# Patient Record
Sex: Female | Born: 1995 | Race: White | Hispanic: Yes | Marital: Single | Smoking: Never smoker
Health system: Southern US, Community
[De-identification: ages and names within clinical notes are randomized; demographics above are authoritative.]

## PROBLEM LIST (undated history)

## (undated) DIAGNOSIS — D649 Anemia, unspecified: Secondary | ICD-10-CM

## (undated) DIAGNOSIS — E119 Type 2 diabetes mellitus without complications: Secondary | ICD-10-CM

## (undated) HISTORY — DX: Type 2 diabetes mellitus without complications: E11.9

## (undated) HISTORY — DX: Anemia, unspecified: D64.9

---
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
## 2013-11-18 ENCOUNTER — Emergency Department (HOSPITAL_COMMUNITY)
Admission: EM | Admit: 2013-11-18 | Discharge: 2013-11-19 | Disposition: A | Discharge status: Home / Self Care | Payer: Self-pay | Attending: Emergency Medicine | Admitting: Emergency Medicine

## 2013-11-18 ENCOUNTER — Encounter (HOSPITAL_COMMUNITY): Payer: Self-pay | Admitting: Emergency Medicine

## 2013-11-18 DIAGNOSIS — R0602 Shortness of breath: Secondary | ICD-10-CM | POA: Insufficient documentation

## 2013-11-18 DIAGNOSIS — Z79899 Other long term (current) drug therapy: Secondary | ICD-10-CM | POA: Insufficient documentation

## 2013-11-18 DIAGNOSIS — R599 Enlarged lymph nodes, unspecified: Secondary | ICD-10-CM | POA: Insufficient documentation

## 2013-11-18 DIAGNOSIS — R062 Wheezing: Secondary | ICD-10-CM | POA: Insufficient documentation

## 2013-11-18 DIAGNOSIS — R509 Fever, unspecified: Secondary | ICD-10-CM | POA: Insufficient documentation

## 2013-11-18 DIAGNOSIS — J069 Acute upper respiratory infection, unspecified: Secondary | ICD-10-CM | POA: Insufficient documentation

## 2013-11-18 MED ORDER — IPRATROPIUM BROMIDE 0.02 % IN SOLN
0.5000 mg | Freq: Once | RESPIRATORY_TRACT | Status: AC
  Administered 2013-11-19: 0.5 mg via RESPIRATORY_TRACT
  Filled 2013-11-18: qty 2.5

## 2013-11-18 MED ORDER — ALBUTEROL SULFATE (5 MG/ML) 0.5% IN NEBU
2.5000 mg | INHALATION_SOLUTION | Freq: Once | RESPIRATORY_TRACT | Status: AC
  Administered 2013-11-19: 2.5 mg via RESPIRATORY_TRACT
  Filled 2013-11-18: qty 0.5

## 2013-11-18 MED ORDER — PREDNISONE 20 MG PO TABS
60.0000 mg | ORAL_TABLET | Freq: Once | ORAL | Status: AC
  Administered 2013-11-19: 60 mg via ORAL
  Filled 2013-11-18: qty 3

## 2013-11-18 NOTE — ED Notes (Signed)
States has had a fever since the 24th, with cough and nasal congestion. Had been visiting nephews on pediatrics who had similar symptoms. No hx of asthma.

## 2013-11-18 NOTE — ED Notes (Signed)
Pt states that she was visiting her brother in the hospital and believes that she may have caught something. Pt states that she has been having chills, dry cough, achyness and sore throat since Tuesday.

## 2013-11-18 NOTE — ED Provider Notes (Signed)
CSN: [id number]     Arrival date & time 11/18/13  2106 History   First MD Initiated Contact with Patient 11/18/13 2230 This chart was scribed for non-physician practitioner Antony Madura, PA-C working with Hurman Horn, MD by Valera Castle, ED scribe. This patient was seen in room TR07C/TR07C and the patient's care was started at 11:31 PM.     Chief Complaint  Patient presents with  . URI   The history is provided by the patient. No language interpreter was used.   HPI Comments: Julie WORRALL is a 17 y.o. female who presents to the Emergency Department complaining of gradually worsening URI symptoms, including dry cough, sore throat, pain when swallowing, chills, and fever, onset 2 weeks ago. She reports today having SOB and wheezing. She reports her max temperature for her fever was 101-102F. She states she thinks she picked up something visiting her brother in the ED. She reports her brother had similar symptoms. She reports she has taken cold and cough medicine, alka-seltzer, and Tylenol, without relief. She denies any other associated symptoms. She denies h/o asthma.  PCP - No PCP Per Patient  History reviewed. No pertinent past medical history. History reviewed. No pertinent past surgical history. History reviewed. No pertinent family history. History  Substance Use Topics  . Smoking status: Never Smoker   . Smokeless tobacco: Not on file  . Alcohol Use: No   OB History   Grav Para Term Preterm Abortions TAB SAB Ect Mult Living                 Review of Systems  Constitutional: Positive for fever and chills.  HENT: Positive for sore throat (pain with swallowing).   Respiratory: Positive for cough (dry), shortness of breath and wheezing.   All other systems reviewed and are negative.   Allergies  Review of patient's allergies indicates no known allergies.  Home Medications   Current Outpatient Rx  Name  Route  Sig  Dispense  Refill  . Phenyleph-CPM-DM-Aspirin  (ALKA-SELTZER PLUS COLD & COUGH PO)   Oral   Take 1 Package by mouth 2 (two) times daily as needed (cough and cold symptoms).         . Phenylephrine-Pheniramine-DM (THERAFLU COLD & COUGH PO)   Oral   Take 1 Package by mouth 2 (two) times daily as needed (cough and cold symptoms).         . fluticasone (FLONASE) 50 MCG/ACT nasal spray   Each Nare   Place 2 sprays into both nostrils daily.   16 g   0   . HYDROcodone-homatropine (HYCODAN) 5-1.5 MG/5ML syrup   Oral   Take 5 mLs by mouth every 6 (six) hours as needed for cough.   120 mL   0   . predniSONE (DELTASONE) 20 MG tablet   Oral   Take 2 tablets (40 mg total) by mouth daily.   10 tablet   0     BP 136/80  Pulse 95  Temp(Src) 98.6 F (37 C) (Oral)  Resp 16  Ht 5\' 2"  (1.575 m)  Wt 353 lb 4 oz (160.233 kg)  BMI 64.59 kg/m2  SpO2 96%  Physical Exam  Nursing note and vitals reviewed. Constitutional: She is oriented to person, place, and time. She appears well-developed and well-nourished. No distress.  HENT:  Head: Normocephalic and atraumatic.  Right Ear: Tympanic membrane, external ear and ear canal normal.  Left Ear: Tympanic membrane, external ear and ear canal normal.  Mouth/Throat: Uvula is midline, oropharynx is clear and moist and mucous membranes are normal. No oropharyngeal exudate or posterior oropharyngeal edema.  +Nasal sinus congestion. Clear serous fluid behind R TM; no bulging or retraction. Patient tolerating secretions without difficulty or drooling  Eyes: Conjunctivae and EOM are normal. Pupils are equal, round, and reactive to light.  Neck: Normal range of motion. Neck supple. No tracheal deviation present.  No nuchal rigidity or meningismus  Cardiovascular: Normal rate, regular rhythm and normal heart sounds.  Exam reveals no gallop and no friction rub.   No murmur heard. Pulmonary/Chest: Effort normal and breath sounds normal. No accessory muscle usage or stridor. No respiratory distress.  She has no wheezes (mild experatory wheezes diffusely.). She has no rales.  Musculoskeletal: Normal range of motion.  Lymphadenopathy:    She has cervical adenopathy (mild anterior cervical adenopathy).  Neurological: She is alert and oriented to person, place, and time.  Skin: Skin is warm and dry. No rash noted. She is not diaphoretic. No erythema. No pallor.  Psychiatric: She has a normal mood and affect. Her behavior is normal.    ED Course  Procedures (including critical care time)  DIAGNOSTIC STUDIES: Oxygen Saturation is 96% on room air, normal by my interpretation.    COORDINATION OF CARE: 11:36 PM-Discussed treatment plan which includes CXR with pt at bedside and pt agreed to plan.   Labs Review Labs Reviewed - No data to display Imaging Review Dg Chest 2 View  11/19/2013   CLINICAL DATA:  Shortness of breath, congestion  EXAM: CHEST  2 VIEW  COMPARISON:  01/10/2010  FINDINGS: The heart size and mediastinal contours are within normal limits. Both lungs are clear. The visualized skeletal structures are unremarkable. Low lung volumes.  IMPRESSION: No active cardiopulmonary disease.   Electronically Signed   By: Ruel Favors M.D.   On: 11/19/2013 00:23    EKG Interpretation   None       MDM   1. Viral URI with cough    Uncomplicated viral URI. Patient well and nontoxic appearing, hemodynamically stable, and afebrile. Lungs with mild expiratory wheezing diffusely; no retractions or accessory muscle use. Uvula midline patient tolerating secretions without difficulty. Airway patent. Patient treated in the ED with DuoNeb and oral steroids with improvement in shortness of breath. Patient ambulates in the ED without hypoxia. Chest x-ray negative for bronchitis or pneumonia. Symptoms most consistent with viral illness. Patient stable and appropriate for discharge with prescriptions for ibuprofen is in course, Flonase, and Hycodan. Albuterol inhaler provided for PRN use for  shortness of breath. Return precautions discussed and patient agreeable to plan with no unaddressed concerns.  I personally performed the services described in this documentation, which was scribed in my presence. The recorded information has been reviewed and is accurate.     Antony Madura, [place] 11/19/13 [phone]

## 2013-11-19 ENCOUNTER — Emergency Department (HOSPITAL_COMMUNITY): Payer: Self-pay

## 2013-11-19 MED ORDER — FLUTICASONE PROPIONATE 50 MCG/ACT NA SUSP: 2.0000 | Freq: Every day | NASAL | Status: DC

## 2013-11-19 MED ORDER — HYDROCODONE-HOMATROPINE 5-1.5 MG/5ML PO SYRP: 5.0000 mL | ORAL_SOLUTION | Freq: Four times a day (QID) | ORAL | Status: DC | PRN

## 2013-11-19 MED ORDER — PREDNISONE 20 MG PO TABS: 40.0000 mg | ORAL_TABLET | Freq: Every day | ORAL | Status: DC

## 2013-11-19 MED ORDER — ALBUTEROL SULFATE HFA 108 (90 BASE) MCG/ACT IN AERS
2.0000 | INHALATION_SPRAY | Freq: Once | RESPIRATORY_TRACT | Status: AC
  Administered 2013-11-19: 2 via RESPIRATORY_TRACT
  Filled 2013-11-19: qty 6.7

## 2013-11-19 NOTE — ED Notes (Signed)
Pulse ox on ambulation 100%.

## 2013-11-19 NOTE — ED Notes (Signed)
Treatment done, cough looser-- per patient

## 2013-11-20 NOTE — ED Provider Notes (Signed)
Medical screening examination/treatment/procedure(s) were performed by non-physician practitioner and as supervising physician I was immediately available for consultation/collaboration.  EKG Interpretation   None        Peighton Edgin, MD 11/20/13 0220 

## 2014-06-13 ENCOUNTER — Emergency Department (HOSPITAL_COMMUNITY)
Admission: EM | Admit: 2014-06-13 | Discharge: 2014-06-14 | Disposition: A | Discharge status: Home / Self Care | Payer: Self-pay | Attending: Emergency Medicine | Admitting: Emergency Medicine

## 2014-06-13 ENCOUNTER — Encounter (HOSPITAL_COMMUNITY): Payer: Self-pay | Admitting: Emergency Medicine

## 2014-06-13 DIAGNOSIS — R51 Headache: Secondary | ICD-10-CM | POA: Insufficient documentation

## 2014-06-13 DIAGNOSIS — R5381 Other malaise: Secondary | ICD-10-CM | POA: Insufficient documentation

## 2014-06-13 DIAGNOSIS — H53149 Visual discomfort, unspecified: Secondary | ICD-10-CM | POA: Insufficient documentation

## 2014-06-13 DIAGNOSIS — R5383 Other fatigue: Secondary | ICD-10-CM

## 2014-06-13 DIAGNOSIS — R112 Nausea with vomiting, unspecified: Secondary | ICD-10-CM | POA: Insufficient documentation

## 2014-06-13 DIAGNOSIS — R519 Headache, unspecified: Secondary | ICD-10-CM

## 2014-06-13 MED ORDER — DIPHENHYDRAMINE HCL 25 MG PO CAPS
25.0000 mg | ORAL_CAPSULE | Freq: Once | ORAL | Status: AC
  Administered 2014-06-13: 25 mg via ORAL
  Filled 2014-06-13: qty 1

## 2014-06-13 MED ORDER — DEXAMETHASONE SODIUM PHOSPHATE 4 MG/ML IJ SOLN
10.0000 mg | Freq: Once | INTRAMUSCULAR | Status: AC
  Administered 2014-06-13: 10 mg via INTRAMUSCULAR
  Filled 2014-06-13: qty 3

## 2014-06-13 MED ORDER — DEXAMETHASONE SODIUM PHOSPHATE 10 MG/ML IJ SOLN
10.0000 mg | Freq: Once | INTRAMUSCULAR | Status: DC
  Filled 2014-06-13: qty 1

## 2014-06-13 MED ORDER — ONDANSETRON 4 MG PO TBDP
8.0000 mg | ORAL_TABLET | Freq: Once | ORAL | Status: AC
  Administered 2014-06-13: 8 mg via ORAL
  Filled 2014-06-13: qty 2

## 2014-06-13 MED ORDER — METOCLOPRAMIDE HCL 10 MG PO TABS
10.0000 mg | ORAL_TABLET | Freq: Once | ORAL | Status: AC
  Administered 2014-06-13: 10 mg via ORAL
  Filled 2014-06-13: qty 1

## 2014-06-13 MED ORDER — KETOROLAC TROMETHAMINE 30 MG/ML IJ SOLN
30.0000 mg | Freq: Once | INTRAMUSCULAR | Status: AC
  Administered 2014-06-13: 30 mg via INTRAMUSCULAR
  Filled 2014-06-13: qty 1

## 2014-06-13 NOTE — ED Notes (Signed)
Patient reports she took 800mg  of motrin for pain around 4:30 this afternoon for pain, with minimal relief.

## 2014-06-13 NOTE — ED Notes (Signed)
Patient has ride at the bedside, discussed to have someone drive patient home and to not drive herself. Patient acknowledges. Reminded that she is still due 2 medications from pharmacy.

## 2014-06-13 NOTE — ED Notes (Signed)
Called and spoke to ED Pharmacist, Clydie BraunKaren about missing medications reglan, PO, and decadron for IM admistration.  She will have missing medications sent.

## 2014-06-13 NOTE — ED Notes (Signed)
The patient said she has been having a headache for over a month and it has gotten worse.  The patient said she has taken ibuprofen with no relief.  She denies any other symptoms other than nausea and she vomited twice yesterday.  The patient is here to be evaluated.

## 2014-06-14 MED ORDER — ONDANSETRON 4 MG PO TBDP
8.0000 mg | ORAL_TABLET | Freq: Once | ORAL | Status: AC
  Administered 2014-06-14: 8 mg via ORAL
  Filled 2014-06-14: qty 2

## 2014-06-14 MED ORDER — ONDANSETRON HCL 4 MG PO TABS: 4.0000 mg | ORAL_TABLET | Freq: Four times a day (QID) | ORAL | Status: DC

## 2014-06-14 NOTE — ED Provider Notes (Signed)
Medical screening examination/treatment/procedure(s) were performed by non-physician practitioner and as supervising physician I was immediately available for consultation/collaboration.   EKG Interpretation None        Aleeza [place], MD 06/14/14 0633 

## 2014-06-14 NOTE — ED Provider Notes (Signed)
CSN: [id number]     Arrival date & time 06/13/14  2057 History   First MD Initiated Contact with Patient 06/13/14 2140     Chief Complaint  Patient presents with  . Headache    The patient said she has been having a headache for over a month and it has gotten worse.  The patient said she has taken ibuprofen with no relief.   HPI Comments: Patient is a 18 y.o. Female who presents to the ED with headache.  Patient states that over the past month she has had increasing frequency for headaches.  Patient states that she feels like there is a circle around her head and it is a squeezing pain.  Patient states that she has had headaches in the past but they seem to be increasing in severity.  Her headache today has been going on for "days".  She has tried taking ibuprofen at home with little relief.  She has not had any fever, chills, visual changes, weakness, numbness, tingling, lightheadedness.  She has had some nausea and vomiting.  Patient does not have a primary care provider at this time.    The history is provided by the patient. No language interpreter was used.    History reviewed. No pertinent past medical history. History reviewed. No pertinent past surgical history. History reviewed. No pertinent family history. History  Substance Use Topics  . Smoking status: Never Smoker   . Smokeless tobacco: Not on file  . Alcohol Use: No   OB History   Grav Para Term Preterm Abortions TAB SAB Ect Mult Living                 Review of Systems  Constitutional: Positive for fatigue. Negative for fever and chills.  HENT: Negative for congestion, facial swelling, rhinorrhea, sinus pressure and sore throat.   Eyes: Positive for photophobia. Negative for pain, redness and visual disturbance.  Respiratory: Negative for cough, chest tightness and shortness of breath.   Cardiovascular: Negative for chest pain and palpitations.  Gastrointestinal: Positive for nausea and vomiting. Negative for abdominal  pain, diarrhea, constipation and blood in stool.  Musculoskeletal: Negative for neck pain and neck stiffness.  All other systems reviewed and are negative.     Allergies  Review of patient's allergies indicates no known allergies.  Home Medications   Prior to Admission medications   Medication Sig Start Date End Date Taking? Authorizing Provider  ibuprofen (ADVIL,MOTRIN) 200 MG tablet Take 400 mg by mouth every 6 (six) hours as needed for moderate pain.   Yes Historical Provider, MD  ondansetron (ZOFRAN) 4 MG tablet Take 1 tablet (4 mg total) by mouth every 6 (six) hours. 06/14/14   Cherlyn Syring A Forcucci, PA-C   BP 131/78  Pulse 82  Temp(Src) 98 F (36.7 C) (Oral)  Resp 16  SpO2 100%  LMP 04/13/2014 Physical Exam  Nursing note and vitals reviewed. Constitutional: She is oriented to person, place, and time. She appears well-developed and well-nourished. No distress.  HENT:  Head: Normocephalic and atraumatic.  Nose: Nose normal.  Mouth/Throat: Oropharynx is clear and moist. No oropharyngeal exudate.  Eyes: Conjunctivae and EOM are normal. Pupils are equal, round, and reactive to light. No scleral icterus.  Neck: Normal range of motion. Neck supple. No JVD present. No rigidity. No Brudzinski's sign and no Kernig's sign noted. No thyromegaly present.  Cardiovascular: Normal rate, regular rhythm, normal heart sounds and intact distal pulses.  Exam reveals no gallop and no friction rub.  No murmur heard. Pulmonary/Chest: Effort normal and breath sounds normal. No respiratory distress. She has no wheezes. She has no rales. She exhibits no tenderness.  Abdominal: Soft. Bowel sounds are normal. She exhibits no distension and no mass. There is no tenderness. There is no rebound and no guarding.  Musculoskeletal: Normal range of motion.  Lymphadenopathy:    She has no cervical adenopathy.  Neurological: She is alert and oriented to person, place, and time. No cranial nerve deficit.  Coordination normal.  Skin: Skin is warm and dry. She is not diaphoretic.  Psychiatric: She has a normal mood and affect. Her behavior is normal. Judgment and thought content normal.    ED Course  Procedures (including critical care time) Labs Review Labs Reviewed - No data to display  Imaging Review No results found.   EKG Interpretation None      MDM   Final diagnoses:  Intractable headache, unspecified chronicity pattern, unspecified headache type   Patient is a 18 y.o. Female who presents to the ED with headaches x 1 month.  Neuro examination here in the ED is normal at this time.  Patient was given IM decadron, toradol, and PO reglan and benadryl with little relief of symptoms here.  Given headache severity will refer the patient to neurology at this time for further workup.  Patient was given a prescription for zofran to help with nausea.  She was told to return for meningitis signs at this time. She states understanding and agreement to the above plan.  She is stable for discharge at this time.   Eben Burowourtney A Forcucci, PA-C 06/14/14 0246

## 2014-06-14 NOTE — Discharge Instructions (Signed)

## 2014-11-30 ENCOUNTER — Emergency Department (HOSPITAL_COMMUNITY)
Admission: EM | Admit: 2014-11-30 | Discharge: 2014-11-30 | Disposition: A | Discharge status: Home / Self Care | Payer: Medicaid Other | Attending: Emergency Medicine | Admitting: Emergency Medicine

## 2014-11-30 ENCOUNTER — Emergency Department (HOSPITAL_COMMUNITY): Payer: Medicaid Other

## 2014-11-30 ENCOUNTER — Encounter (HOSPITAL_COMMUNITY): Payer: Self-pay | Admitting: Emergency Medicine

## 2014-11-30 DIAGNOSIS — R197 Diarrhea, unspecified: Secondary | ICD-10-CM | POA: Diagnosis not present

## 2014-11-30 DIAGNOSIS — Z79899 Other long term (current) drug therapy: Secondary | ICD-10-CM | POA: Diagnosis not present

## 2014-11-30 DIAGNOSIS — Z791 Long term (current) use of non-steroidal anti-inflammatories (NSAID): Secondary | ICD-10-CM | POA: Insufficient documentation

## 2014-11-30 DIAGNOSIS — Z3202 Encounter for pregnancy test, result negative: Secondary | ICD-10-CM | POA: Diagnosis not present

## 2014-11-30 DIAGNOSIS — R1011 Right upper quadrant pain: Secondary | ICD-10-CM | POA: Insufficient documentation

## 2014-11-30 DIAGNOSIS — E669 Obesity, unspecified: Secondary | ICD-10-CM | POA: Diagnosis not present

## 2014-11-30 DIAGNOSIS — D72829 Elevated white blood cell count, unspecified: Secondary | ICD-10-CM | POA: Diagnosis not present

## 2014-11-30 DIAGNOSIS — R112 Nausea with vomiting, unspecified: Secondary | ICD-10-CM | POA: Diagnosis not present

## 2014-11-30 DIAGNOSIS — R109 Unspecified abdominal pain: Secondary | ICD-10-CM | POA: Diagnosis present

## 2014-11-30 LAB — COMPREHENSIVE METABOLIC PANEL
ALT: 30 U/L (ref 0–35)
AST: 25 U/L (ref 0–37)
Albumin: 3.7 g/dL (ref 3.5–5.2)
Alkaline Phosphatase: 115 U/L (ref 39–117)
Anion gap: 10 (ref 5–15)
BUN: 14 mg/dL (ref 6–23)
CO2: 25 mmol/L (ref 19–32)
Calcium: 8.8 mg/dL (ref 8.4–10.5)
Chloride: 103 mEq/L (ref 96–112)
Creatinine, Ser: 0.65 mg/dL (ref 0.50–1.10)
GFR calc Af Amer: >90 mL/min (ref >90)
Glucose, Bld: 99 mg/dL (ref 70–99)
Potassium: 3.6 mmol/L (ref 3.5–5.1)
Sodium: 138 mmol/L (ref 135–145)
Total Bilirubin: 0.6 mg/dL (ref 0.3–1.2)
Total Protein: 7.5 g/dL (ref 6.0–8.3)

## 2014-11-30 LAB — CBC WITH DIFFERENTIAL/PLATELET
BASOS ABS: 0 10*3/uL (ref 0.0–0.1)
Basophils Relative: 0 % (ref 0–1)
EOS ABS: 0.3 10*3/uL (ref 0.0–0.7)
Eosinophils Relative: 2 % (ref 0–5)
HEMATOCRIT: 40.9 % (ref 36.0–46.0)
Hemoglobin: 13.3 g/dL (ref 12.0–15.0)
Lymphocytes Relative: 13 % (ref 12–46)
Lymphs Abs: 1.7 10*3/uL (ref 0.7–4.0)
MCH: 27.4 pg (ref 26.0–34.0)
MCHC: 32.5 g/dL (ref 30.0–36.0)
MCV: 84.2 fL (ref 78.0–100.0)
Monocytes Absolute: 0.6 10*3/uL (ref 0.1–1.0)
Monocytes Relative: 5 % (ref 3–12)
Neutro Abs: 10.2 10*3/uL — ABNORMAL HIGH (ref 1.7–7.7)
Neutrophils Relative %: 80 % — ABNORMAL HIGH (ref 43–77)
Platelets: 383 10*3/uL (ref 150–400)
RBC: 4.86 MIL/uL (ref 3.87–5.11)
RDW: 13.9 % (ref 11.5–15.5)
WBC: 12.7 10*3/uL — AB (ref 4.0–10.5)

## 2014-11-30 LAB — URINALYSIS, ROUTINE W REFLEX MICROSCOPIC
Glucose, UA: NEGATIVE mg/dL
Ketones, ur: 15 mg/dL — AB
Leukocytes, UA: NEGATIVE
Nitrite: NEGATIVE
PH: 5 (ref 5.0–8.0)
Protein, ur: 30 mg/dL — AB
SPECIFIC GRAVITY, URINE: 1.035 — AB (ref 1.005–1.030)
Urobilinogen, UA: 1 mg/dL (ref 0.0–1.0)

## 2014-11-30 LAB — URINE MICROSCOPIC-ADD ON

## 2014-11-30 LAB — PREGNANCY, URINE: PREG TEST UR: NEGATIVE

## 2014-11-30 LAB — LIPASE, BLOOD: Lipase: 25 U/L (ref 11–59)

## 2014-11-30 MED ORDER — ONDANSETRON HCL 4 MG/2ML IJ SOLN: 4.0000 mg | Freq: Once | INTRAMUSCULAR | Status: DC

## 2014-11-30 MED ORDER — FAMOTIDINE 20 MG PO TABS
20.0000 mg | ORAL_TABLET | Freq: Once | ORAL | Status: AC
Start: 2014-11-30 — End: 2014-11-30
  Administered 2014-11-30: 20 mg via ORAL
  Filled 2014-11-30: qty 1

## 2014-11-30 MED ORDER — MORPHINE SULFATE 4 MG/ML IJ SOLN
6.0000 mg | Freq: Once | INTRAMUSCULAR | Status: AC
  Administered 2014-11-30: 6 mg via INTRAMUSCULAR
  Filled 2014-11-30: qty 2

## 2014-11-30 MED ORDER — SODIUM CHLORIDE 0.9 % IV BOLUS (SEPSIS): 1000.0000 mL | Freq: Once | INTRAVENOUS | Status: DC

## 2014-11-30 MED ORDER — OXYCODONE-ACETAMINOPHEN 5-325 MG PO TABS: ORAL_TABLET | ORAL | Status: DC

## 2014-11-30 MED ORDER — HYDROMORPHONE HCL 1 MG/ML IJ SOLN: 0.5000 mg | Freq: Once | INTRAMUSCULAR | Status: DC

## 2014-11-30 MED ORDER — ONDANSETRON 4 MG PO TBDP
8.0000 mg | ORAL_TABLET | Freq: Once | ORAL | Status: AC
  Administered 2014-11-30: 8 mg via ORAL
  Filled 2014-11-30: qty 2

## 2014-11-30 MED ORDER — ONDANSETRON HCL 4 MG PO TABS
4.0000 mg | ORAL_TABLET | Freq: Four times a day (QID) | ORAL | Status: DC | PRN
Start: 2014-11-30 — End: 2015-05-16

## 2014-11-30 MED ORDER — FAMOTIDINE 20 MG PO TABS: 20.0000 mg | ORAL_TABLET | Freq: Two times a day (BID) | ORAL | Status: DC

## 2014-11-30 NOTE — ED Notes (Signed)
Pt in c/o generalized abd pain with nausea and vomiting for the last month, no distress noted

## 2014-11-30 NOTE — ED Notes (Signed)
Pt. Left with all belongings and refused wheelchair 

## 2014-11-30 NOTE — Discharge Instructions (Signed)
Take percocet for breakthrough pain, do not drink alcohol, drive, care for children or do other critical tasks while taking percocet.  Push fluids: take small frequent sips of water or Gatorade, do not drink any soda, juice or caffeinated beverages.    Slowly resume solid diet as desired. Avoid food that are spicy, contain dairy and/or have high fat content.  Aviod NSAIDs (aspirin, motrin, ibuprofen, naproxen, Aleve et Karie Soda) for pain control because they will irritate your stomach.  Return to the emergency room for severely worsening abdominal pain, abdominal pain that localizes to a particular area (especially the right lower part of the belly), pain that persists past 8-10 hours, blood in stool or vomit, severe weakness, fainting, or fever.   Maintain hydration by drinking small amounts of clear fluids frequently, then soft diet, and then advance to a solid diet as tolerated. Avoid foods that are spicy, high in fat or dairy.  Please follow with your primary care doctor in the next 2 days for a check-up. They must obtain records for further management.   Do not hesitate to return to the Emergency Department for any new, worsening or concerning symptoms.   Abdominal Pain, Women Abdominal (stomach, pelvic, or belly) pain can be caused by many things. It is important to tell your doctor:  The location of the pain.  Does it come and go or is it present all the time?  Are there things that start the pain (eating certain foods, exercise)?  Are there other symptoms associated with the pain (fever, nausea, vomiting, diarrhea)? All of this is helpful to know when trying to find the cause of the pain. CAUSES   Stomach: virus or bacteria infection, or ulcer.  Intestine: appendicitis (inflamed appendix), regional ileitis (Crohn's disease), ulcerative colitis (inflamed colon), irritable bowel syndrome, diverticulitis (inflamed diverticulum of the colon), or cancer of the stomach or  intestine.  Gallbladder disease or stones in the gallbladder.  Kidney disease, kidney stones, or infection.  Pancreas infection or cancer.  Fibromyalgia (pain disorder).  Diseases of the female organs:  Uterus: fibroid (non-cancerous) tumors or infection.  Fallopian tubes: infection or tubal pregnancy.  Ovary: cysts or tumors.  Pelvic adhesions (scar tissue).  Endometriosis (uterus lining tissue growing in the pelvis and on the pelvic organs).  Pelvic congestion syndrome (female organs filling up with blood just before the menstrual period).  Pain with the menstrual period.  Pain with ovulation (producing an egg).  Pain with an IUD (intrauterine device, birth control) in the uterus.  Cancer of the female organs.  Functional pain (pain not caused by a disease, may improve without treatment).  Psychological pain.  Depression. DIAGNOSIS  Your doctor will decide the seriousness of your pain by doing an examination.  Blood tests.  X-rays.  Ultrasound.  CT scan (computed tomography, special type of X-ray).  MRI (magnetic resonance imaging).  Cultures, for infection.  Barium enema (dye inserted in the large intestine, to better view it with X-rays).  Colonoscopy (looking in intestine with a lighted tube).  Laparoscopy (minor surgery, looking in abdomen with a lighted tube).  Major abdominal exploratory surgery (looking in abdomen with a large incision). TREATMENT  The treatment will depend on the cause of the pain.   Many cases can be observed and treated at home.  Over-the-counter medicines recommended by your caregiver.  Prescription medicine.  Antibiotics, for infection.  Birth control pills, for painful periods or for ovulation pain.  Hormone treatment, for endometriosis.  Nerve blocking injections.  Physical therapy.  Antidepressants.  Counseling with a psychologist or psychiatrist.  Minor or major surgery. HOME CARE INSTRUCTIONS   Do  not take laxatives, unless directed by your caregiver.  Take over-the-counter pain medicine only if ordered by your caregiver. Do not take aspirin because it can cause an upset stomach or bleeding.  Try a clear liquid diet (broth or water) as ordered by your caregiver. Slowly move to a bland diet, as tolerated, if the pain is related to the stomach or intestine.  Have a thermometer and take your temperature several times a day, and record it.  Bed rest and sleep, if it helps the pain.  Avoid sexual intercourse, if it causes pain.  Avoid stressful situations.  Keep your follow-up appointments and tests, as your caregiver orders.  If the pain does not go away with medicine or surgery, you may try:  Acupuncture.  Relaxation exercises (yoga, meditation).  Group therapy.  Counseling. SEEK MEDICAL CARE IF:   You notice certain foods cause stomach pain.  Your home care treatment is not helping your pain.  You need stronger pain medicine.  You want your IUD removed.  You feel faint or lightheaded.  You develop nausea and vomiting.  You develop a rash.  You are having side effects or an allergy to your medicine. SEEK IMMEDIATE MEDICAL CARE IF:   Your pain does not go away or gets worse.  You have a fever.  Your pain is felt only in portions of the abdomen. The right side could possibly be appendicitis. The left lower portion of the abdomen could be colitis or diverticulitis.  You are passing blood in your stools (bright red or black tarry stools, with or without vomiting).  You have blood in your urine.  You develop chills, with or without a fever.  You pass out. MAKE SURE YOU:   Understand these instructions.  Will watch your condition.  Will get help right away if you are not doing well or get worse. Document Released: 09/06/2007 Document Revised: 03/26/2014 Document Reviewed: 09/26/2009 [facility] Patient Information 2015 [place], [place]. This information is  not intended to replace advice given to you by your health care provider. Make sure you discuss any questions you have with your health care provider.

## 2014-11-30 NOTE — ED Provider Notes (Signed)
CSN: [id number]     Arrival date & time 11/30/14  1541 History   First MD Initiated Contact with Patient 11/30/14 1923     Chief Complaint  Patient presents with  . Abdominal Pain     (Consider location/radiation/quality/duration/timing/severity/associated sxs/prior Treatment) HPI   Julie Banks is a 19 y.o. female complaining of right upper quadrant abdominal pain sometimes radiates to the left side, it is postprandial most of the time. It is becoming more severe. She has nonbloody, nonbilious, coffee-ground emesis is becoming more frequent, she had 5 episodes today. Patient also notes 5 episodes of loose stools starting today. She states the pain is 10 out of 10, she denies fever, chills, lower abdominal pain, abnormal vaginal discharge, dysuria, hematuria. Patient states she has irregular periods. Recently got her Medicaid reinstated however her primary care as described on her Medicaid card is a mental health professional, patient has no history of psychiatric issues.  History reviewed. No pertinent past medical history. History reviewed. No pertinent past surgical history. History reviewed. No pertinent family history. History  Substance Use Topics  . Smoking status: Never Smoker   . Smokeless tobacco: Not on file  . Alcohol Use: No   OB History    No data available     Review of Systems  10 systems reviewed and found to be negative, except as noted in the HPI.   Allergies  Review of patient's allergies indicates no known allergies.  Home Medications   Prior to Admission medications   Medication Sig Start Date End Date Taking? Authorizing Provider  ibuprofen (ADVIL,MOTRIN) 200 MG tablet Take 400 mg by mouth every 6 (six) hours as needed for moderate pain.    Historical Provider, MD  ondansetron (ZOFRAN) 4 MG tablet Take 1 tablet (4 mg total) by mouth every 6 (six) hours. 06/14/14   Courtney A Forcucci, PA-C   BP 146/83 mmHg  Pulse 99  Temp(Src) 98.9 F (37.2 C) (Oral)   Resp 18  Ht  (1.575 m)  SpO2 100% Physical Exam  Constitutional: She is oriented to person, place, and time. She appears well-developed and well-nourished. No distress.  Obese  HENT:  Head: Normocephalic and atraumatic.  Mouth/Throat: Oropharynx is clear and moist.  Eyes: Conjunctivae and EOM are normal. Pupils are equal, round, and reactive to light.  Neck: Normal range of motion.  Cardiovascular: Normal rate, regular rhythm and intact distal pulses.   Pulmonary/Chest: Effort normal and breath sounds normal. No stridor.  Abdominal: Soft. Bowel sounds are normal. She exhibits no distension and no mass. There is tenderness. There is no rebound and no guarding.  Positive Murphy sign, no guarding or rebound  Musculoskeletal: Normal range of motion.  Neurological: She is alert and oriented to person, place, and time.  Psychiatric: She has a normal mood and affect.  Nursing note and vitals reviewed.   ED Course  Procedures (including critical care time) Labs Review Labs Reviewed  CBC WITH DIFFERENTIAL - Abnormal; Notable for the following:    WBC 12.7 (*)    Neutrophils Relative % 80 (*)    Neutro Abs 10.2 (*)    All other components within normal limits  URINALYSIS, ROUTINE W REFLEX MICROSCOPIC - Abnormal; Notable for the following:    Color, Urine AMBER (*)    APPearance CLOUDY (*)    Specific Gravity, Urine 1.035 (*)    Hgb urine dipstick LARGE (*)    Bilirubin Urine SMALL (*)    Ketones, ur 15 (*)  Protein, ur 30 (*)    All other components within normal limits  URINE MICROSCOPIC-ADD ON - Abnormal; Notable for the following:    Squamous Epithelial / LPF FEW (*)    Bacteria, UA FEW (*)    All other components within normal limits  COMPREHENSIVE METABOLIC PANEL  LIPASE, BLOOD  PREGNANCY, URINE    Imaging Review [place] Abdomen Complete  11/30/2014   CLINICAL DATA:  [age] year old female with abdominal pain.  EXAM: ULTRASOUND ABDOMEN COMPLETE  COMPARISON:  CT of the  abdomen and pelvis 03/21/2009.  FINDINGS: Gallbladder: No gallstones or wall thickening visualized. No sonographic Murphy sign noted.  Common bile duct: Diameter: 3.5 mm in the porta hepatis  Liver: No focal lesion identified. Within normal limits in parenchymal echogenicity.  IVC: No abnormality visualized.  Pancreas: Visualized portion unremarkable.  Spleen: Size and appearance within normal limits. 10.5 cm in length.  Right Kidney: Length: 12.4 cm. Echogenicity within normal limits. No mass or hydronephrosis visualized.  Left Kidney: Length: 12.1 cm. Echogenicity within normal limits. No mass or hydronephrosis visualized.  Abdominal aorta: No aneurysm visualized.  Other findings: None.  IMPRESSION: 1. No acute abnormalities.  Normal abdominal ultrasound.   Electronically Signed   By: Trudie Reedaniel  Entrikin M.D.   On: 11/30/2014 21:20     EKG Interpretation None      MDM   Final diagnoses:  RUQ abdominal pain  Non-intractable vomiting with nausea, vomiting of unspecified type    Filed Vitals:   11/30/14 1604 11/30/14 1942  BP: 102/81 146/83  Pulse: 90 99  Temp: 98.9 F (37.2 C)   TempSrc: Oral   Resp: 18 18  Height: 5\' 2"  (1.575 m)   SpO2: 96% 100%    Medications  sodium chloride 0.9 % bolus 1,000 mL (not administered)  HYDROmorphone (DILAUDID) injection 0.5 mg (not administered)  ondansetron (ZOFRAN) injection 4 mg (not administered)    Julie Banks is a pleasant 19 y.o. female presenting with severe right upper quadrant abdominal pain which radiates around to the left side this is worsening over the course of the month, there is associated nausea and vomiting also diarrhea, she states the symptoms are worsening. Patient is afebrile, well-appearing, well-hydrated she is tender in the right upper quadrant with no peritoneal signs. Blood work with no significant abnormalities, she does have a mild leukocytosis of 12.7.  It is difficult to obtain an IV on this patient, she is amenable  to IM pain medication, she was given ODT and is tolerating by mouth. She states that her pain is significantly improved and is now 4 out of 10. She has past by mouth challenge. Ultrasound shows no sign of cholecystitis or any other abnormalities.  I will discharge her home with Pepcid for presumed peptic ulcer disease, Percocet for pain control and also Zofran for nausea. We've had an extensive discussion of return precautions. Patient recently got her Medicaid and may have trouble getting into see her primary care doctor in a timely fashion. I've advised her to return to ED for a checkup if she still is having these symptoms in 3-5 days.  Evaluation does not show pathology that would require ongoing emergent intervention or inpatient treatment. Pt is hemodynamically stable and mentating appropriately. Discussed findings and plan with patient/guardian, who agrees with care plan. All questions answered. Return precautions discussed and outpatient follow up given.  \   Wynetta Emeryicole Abbigal Radich, PA-C 12/01/14 0109  Juliet RudeNathan R. Rubin PayorPickering, MD 12/01/14 [id number]

## 2014-11-30 NOTE — ED Notes (Signed)
Pt in c/o generalized abd pain and nausea, symptoms have been intermittent, no distress noted

## 2014-11-30 NOTE — ED Notes (Signed)
Pt is tolerating PO fluids.  

## 2014-11-30 NOTE — ED Notes (Signed)
This RN and Julie PeonErin, RN unable to gain IV access.   Pt taken to ultrasound.

## 2015-01-01 ENCOUNTER — Encounter (HOSPITAL_COMMUNITY): Payer: Self-pay | Admitting: *Deleted

## 2015-01-01 ENCOUNTER — Emergency Department (HOSPITAL_COMMUNITY)
Admission: EM | Admit: 2015-01-01 | Discharge: 2015-01-02 | Disposition: A | Discharge status: Home / Self Care | Payer: Medicaid Other | Attending: Emergency Medicine | Admitting: Emergency Medicine

## 2015-01-01 DIAGNOSIS — R110 Nausea: Secondary | ICD-10-CM | POA: Insufficient documentation

## 2015-01-01 DIAGNOSIS — R1011 Right upper quadrant pain: Secondary | ICD-10-CM | POA: Diagnosis not present

## 2015-01-01 DIAGNOSIS — R109 Unspecified abdominal pain: Secondary | ICD-10-CM | POA: Diagnosis present

## 2015-01-01 DIAGNOSIS — Z3202 Encounter for pregnancy test, result negative: Secondary | ICD-10-CM | POA: Diagnosis not present

## 2015-01-01 DIAGNOSIS — Z79899 Other long term (current) drug therapy: Secondary | ICD-10-CM | POA: Diagnosis not present

## 2015-01-01 LAB — CBC WITH DIFFERENTIAL/PLATELET
Basophils Absolute: 0 10*3/uL (ref 0.0–0.1)
Basophils Relative: 0 % (ref 0–1)
EOS ABS: 0.3 10*3/uL (ref 0.0–0.7)
Eosinophils Relative: 3 % (ref 0–5)
HCT: 36.1 % (ref 36.0–46.0)
HEMOGLOBIN: 12 g/dL (ref 12.0–15.0)
LYMPHS ABS: 2.8 10*3/uL (ref 0.7–4.0)
LYMPHS PCT: 28 % (ref 12–46)
MCH: 27.7 pg (ref 26.0–34.0)
MCHC: 33.2 g/dL (ref 30.0–36.0)
MCV: 83.4 fL (ref 78.0–100.0)
Monocytes Absolute: 0.5 10*3/uL (ref 0.1–1.0)
Monocytes Relative: 5 % (ref 3–12)
NEUTROS PCT: 64 % (ref 43–77)
Neutro Abs: 6.6 10*3/uL (ref 1.7–7.7)
Platelets: 376 10*3/uL (ref 150–400)
RBC: 4.33 MIL/uL (ref 3.87–5.11)
RDW: 14 % (ref 11.5–15.5)
WBC: 10.2 10*3/uL (ref 4.0–10.5)

## 2015-01-01 LAB — COMPREHENSIVE METABOLIC PANEL
ALT: 25 U/L (ref 0–35)
AST: 23 U/L (ref 0–37)
Albumin: 3.5 g/dL (ref 3.5–5.2)
Alkaline Phosphatase: 121 U/L — ABNORMAL HIGH (ref 39–117)
Anion gap: 8 (ref 5–15)
BUN: 11 mg/dL (ref 6–23)
CALCIUM: 9 mg/dL (ref 8.4–10.5)
CO2: 23 mmol/L (ref 19–32)
CREATININE: 0.58 mg/dL (ref 0.50–1.10)
Chloride: 108 mmol/L (ref 96–112)
GFR calc Af Amer: >90 mL/min (ref >90)
Glucose, Bld: 128 mg/dL — ABNORMAL HIGH (ref 70–99)
Potassium: 3.7 mmol/L (ref 3.5–5.1)
Sodium: 139 mmol/L (ref 135–145)
Total Bilirubin: 0.3 mg/dL (ref 0.3–1.2)
Total Protein: 7.3 g/dL (ref 6.0–8.3)

## 2015-01-01 LAB — URINE MICROSCOPIC-ADD ON

## 2015-01-01 LAB — URINALYSIS, ROUTINE W REFLEX MICROSCOPIC
Bilirubin Urine: NEGATIVE
Glucose, UA: NEGATIVE mg/dL
Ketones, ur: NEGATIVE mg/dL
LEUKOCYTES UA: NEGATIVE
NITRITE: NEGATIVE
Protein, ur: NEGATIVE mg/dL
SPECIFIC GRAVITY, URINE: 1.023 (ref 1.005–1.030)
Urobilinogen, UA: 1 mg/dL (ref 0.0–1.0)
pH: 8 (ref 5.0–8.0)

## 2015-01-01 LAB — PREGNANCY, URINE: Preg Test, Ur: NEGATIVE

## 2015-01-01 LAB — LIPASE, BLOOD: LIPASE: 28 U/L (ref 11–59)

## 2015-01-01 MED ORDER — SODIUM CHLORIDE 0.9 % IV BOLUS (SEPSIS)
1000.0000 mL | Freq: Once | INTRAVENOUS | Status: AC
  Administered 2015-01-01: 1000 mL via INTRAVENOUS

## 2015-01-01 MED ORDER — IOHEXOL 300 MG/ML  SOLN
25.0000 mL | INTRAMUSCULAR | Status: AC
  Administered 2015-01-01: 25 mL via ORAL

## 2015-01-01 NOTE — ED Notes (Signed)
Pt reports intermittent abdominal pain and nausea for 2-3 months. LMP 1/8

## 2015-01-01 NOTE — ED Notes (Signed)
Ordered hosp bed 

## 2015-01-02 ENCOUNTER — Encounter (HOSPITAL_COMMUNITY): Payer: Self-pay

## 2015-01-02 ENCOUNTER — Emergency Department (HOSPITAL_COMMUNITY): Payer: Medicaid Other

## 2015-01-02 MED ORDER — HYDROCODONE-ACETAMINOPHEN 5-325 MG PO TABS: 1.0000 | ORAL_TABLET | Freq: Four times a day (QID) | ORAL | Status: DC | PRN

## 2015-01-02 MED ORDER — IOHEXOL 300 MG/ML  SOLN
100.0000 mL | Freq: Once | INTRAMUSCULAR | Status: AC | PRN
  Administered 2015-01-02: 100 mL via INTRAVENOUS

## 2015-01-02 NOTE — ED Provider Notes (Signed)
CSN: [id number]     Arrival date & time 01/01/15  1743 History   First MD Initiated Contact with Patient 01/01/15 2034     Chief Complaint  Patient presents with  . Abdominal Pain  . Nausea     (Consider location/radiation/quality/duration/timing/severity/associated sxs/prior Treatment) HPI  Patient presents today complaining of prolonged menstrual period along with RUQ pain.  She was seen a month ago here for the same abdominal pain at the time she was on her menstrual period; however, it has not stopped.  She uses about 7-8 heavy pads a day with full saturation.  Age of menarche 67 with a history of irregular periods ranging from 2d in length to this month long episode.  Her emesis from her previous visit has since resolved, however she still experiences nausea and RUQ.  Denies dysuria or dyspareunia.  The patient states that nothing seems make her condition better or worse.  Patient denies nausea, vomiting, weakness, dizziness, headache, blurred vision, dysuria, chest pain, shortness of breath, fever, cough, rhinitis, sore throat, or syncope  History reviewed. No pertinent past medical history. History reviewed. No pertinent past surgical history. History reviewed. No pertinent family history. History  Substance Use Topics  . Smoking status: Never Smoker   . Smokeless tobacco: Not on file  . Alcohol Use: No   OB History    No data available     Review of Systems  All other systems reviewed and are negative.   All other systems negative except as documented in the HPI. All pertinent positives and negatives as reviewed in the HPI.  Allergies  Review of patient's allergies indicates no known allergies.  Home Medications   Prior to Admission medications   Medication Sig Start Date End Date Taking? Authorizing Provider  famotidine (PEPCID) 20 MG tablet Take 1 tablet (20 mg total) by mouth 2 (two) times daily. 11/30/14  Yes Nicole Pisciotta, PA-C  ibuprofen (ADVIL,MOTRIN) 200 MG  tablet Take 400 mg by mouth every 6 (six) hours as needed for moderate pain.   Yes Historical Provider, MD  ondansetron (ZOFRAN) 4 MG tablet Take 1 tablet (4 mg total) by mouth every 6 (six) hours as needed for nausea or vomiting. Patient not taking: Reported on 01/01/2015 11/30/14   Joni Reining Pisciotta, PA-C  oxyCODONE-acetaminophen (PERCOCET/ROXICET) 5-325 MG per tablet 1 to 2 tabs PO q6hrs  PRN for pain Patient not taking: Reported on 01/01/2015 11/30/14   Joni Reining Pisciotta, PA-C   BP 105/63 mmHg  Pulse 95  Temp(Src) 98 F (36.7 C) (Oral)  Resp 18  SpO2 100% Physical Exam  Constitutional: She is oriented to person, place, and time. She appears well-developed and well-nourished.  HENT:  Head: Normocephalic and atraumatic.  Mouth/Throat: Oropharynx is clear and moist.  Eyes: Pupils are equal, round, and reactive to light.  Neck: Normal range of motion. Neck supple.  Cardiovascular: Normal rate, regular rhythm and normal heart sounds.  Exam reveals no friction rub.   No murmur heard. Pulmonary/Chest: Effort normal and breath sounds normal. No respiratory distress. She has no wheezes. She has no rales.  Abdominal: Soft. Bowel sounds are normal. She exhibits no distension. There is tenderness in the right upper quadrant. There is no rigidity, no rebound, no guarding and no CVA tenderness.  Musculoskeletal: She exhibits no edema.  Neurological: She is alert and oriented to person, place, and time. She exhibits normal muscle tone. Coordination normal.  Skin: Skin is warm and dry. No rash noted. No erythema.  Nursing note  and vitals reviewed.   ED Course  Procedures (including critical care time) Labs Review Labs Reviewed  COMPREHENSIVE METABOLIC PANEL - Abnormal; Notable for the following:    Glucose, Bld 128 (*)    Alkaline Phosphatase 121 (*)    All other components within normal limits  URINALYSIS, ROUTINE W REFLEX MICROSCOPIC - Abnormal; Notable for the following:    Hgb urine dipstick  MODERATE (*)    All other components within normal limits  CBC WITH DIFFERENTIAL/PLATELET  LIPASE, BLOOD  PREGNANCY, URINE  URINE MICROSCOPIC-ADD ON   Patient is referred to GYN.  Told to return here as needed.  Patient  agrees to the plan.  All questions were answered.  Patient has chronic lower intermittent abdominal pain for the last 2-3 months   MDM   Final diagnoses:  None        Carlyle DollyChristopher W Brettney Ficken, PA-C 01/04/15 2111  Flint MelterElliott L Wentz, MD 01/08/15 [phone]

## 2015-01-02 NOTE — Discharge Instructions (Signed)
Return here as needed. Follow up with the doctors provided.  °

## 2015-01-12 ENCOUNTER — Encounter (HOSPITAL_COMMUNITY): Payer: Self-pay

## 2015-01-12 ENCOUNTER — Inpatient Hospital Stay (HOSPITAL_COMMUNITY)
Admission: AD | Admit: 2015-01-12 | Discharge: 2015-01-12 | Disposition: A | Discharge status: Home / Self Care | Payer: Medicaid Other | Source: Ambulatory Visit | Attending: Obstetrics and Gynecology | Admitting: Obstetrics and Gynecology

## 2015-01-12 DIAGNOSIS — R1011 Right upper quadrant pain: Secondary | ICD-10-CM | POA: Diagnosis not present

## 2015-01-12 DIAGNOSIS — N938 Other specified abnormal uterine and vaginal bleeding: Secondary | ICD-10-CM | POA: Diagnosis not present

## 2015-01-12 DIAGNOSIS — N939 Abnormal uterine and vaginal bleeding, unspecified: Secondary | ICD-10-CM | POA: Diagnosis present

## 2015-01-12 LAB — URINALYSIS, ROUTINE W REFLEX MICROSCOPIC
BILIRUBIN URINE: NEGATIVE
GLUCOSE, UA: NEGATIVE mg/dL
KETONES UR: NEGATIVE mg/dL
LEUKOCYTES UA: NEGATIVE
Nitrite: NEGATIVE
PROTEIN: NEGATIVE mg/dL
Specific Gravity, Urine: >1.03 — ABNORMAL HIGH (ref 1.005–1.030)
Urobilinogen, UA: 0.2 mg/dL (ref 0.0–1.0)
pH: 5.5 (ref 5.0–8.0)

## 2015-01-12 LAB — CBC
HEMATOCRIT: 36.9 % (ref 36.0–46.0)
Hemoglobin: 12.3 g/dL (ref 12.0–15.0)
MCH: 28.1 pg (ref 26.0–34.0)
MCHC: 33.3 g/dL (ref 30.0–36.0)
MCV: 84.2 fL (ref 78.0–100.0)
Platelets: 360 10*3/uL (ref 150–400)
RBC: 4.38 MIL/uL (ref 3.87–5.11)
RDW: 13.8 % (ref 11.5–15.5)
WBC: 9.8 10*3/uL (ref 4.0–10.5)

## 2015-01-12 LAB — URINE MICROSCOPIC-ADD ON

## 2015-01-12 LAB — WET PREP, GENITAL
Trich, Wet Prep: NONE SEEN
YEAST WET PREP: NONE SEEN

## 2015-01-12 LAB — POCT PREGNANCY, URINE: PREG TEST UR: NEGATIVE

## 2015-01-12 MED ORDER — IBUPROFEN 600 MG PO TABS
600.0000 mg | ORAL_TABLET | ORAL | Status: AC
  Administered 2015-01-12: 600 mg via ORAL
  Filled 2015-01-12: qty 1

## 2015-01-12 NOTE — Discharge Instructions (Signed)
Abnormal Uterine Bleeding Abnormal uterine bleeding can affect women at various stages in life, including teenagers, women in their reproductive years, pregnant women, and women who have reached menopause. Several kinds of uterine bleeding are considered abnormal, including:  Bleeding or spotting between periods.   Bleeding after sexual intercourse.   Bleeding that is heavier or more than normal.   Periods that last longer than usual.  Bleeding after menopause.  Many cases of abnormal uterine bleeding are minor and simple to treat, while others are more serious. Any type of abnormal bleeding should be evaluated by your health care provider. Treatment will depend on the cause of the bleeding. HOME CARE INSTRUCTIONS Monitor your condition for any changes. The following actions may help to alleviate any discomfort you are experiencing:  Avoid the use of tampons and douches as directed by your health care provider.  Change your pads frequently. You should get regular pelvic exams and Pap tests. Keep all follow-up appointments for diagnostic tests as directed by your health care provider.  SEEK MEDICAL CARE IF:   Your bleeding lasts more than 1 week.   You feel dizzy at times.  SEEK IMMEDIATE MEDICAL CARE IF:   You pass out.   You are changing pads every 15 to 30 minutes.   You have abdominal pain.  You have a fever.   You become sweaty or weak.   You are passing large blood clots from the vagina.   You start to feel nauseous and vomit. MAKE SURE YOU:   Understand these instructions.  Will watch your condition.  Will get help right away if you are not doing well or get worse. Document Released: 11/09/2005 Document Revised: 11/14/2013 Document Reviewed: 06/08/2013 Exit[employer] Patient Information 2015 [employer]. This information is not intended to replace advice given to you by your health care provider. Make sure you discuss any questions you have with your  health care provider.  

## 2015-01-12 NOTE — MAU Note (Signed)
Pt presents complaining of abdominal pain x2-3 months and a prolonged menstrual cycle that started 1/8. States she is having nausea with eating.

## 2015-01-12 NOTE — MAU Provider Note (Signed)
History     CSN: [id number]  Arrival date and time: 01/12/15 1157   First Provider Initiated Contact with Patient 01/12/15 1318      Chief Complaint  Patient presents with  . Vaginal Bleeding  . Abdominal Pain   HPI Julie Banks 19 y.o. No obstetric history on file. Nonpregnant female presents to MAU complaining of daily vaginal bleeding since 1/8.  It has often been very heavy but has lightened some over the last 2-3 days. 2-3 weeks ago, she noticed the cramping started - in the mid lower abdomen.  It is 8/10 now.  She used oxycodone for this last night around 8pm and no treatment since.   She is nauseous throughout the day and some lightheadedness.  She vomits occasionally after eating.  No diarrhea or constipation, back pain, dysuria, weakness, fever.   OB History    No data available      History reviewed. No pertinent past medical history.  History reviewed. No pertinent past surgical history.  History reviewed. No pertinent family history.  History  Substance Use Topics  . Smoking status: Never Smoker   . Smokeless tobacco: Not on file  . Alcohol Use: No    Allergies: No Known Allergies  Prescriptions prior to admission  Medication Sig Dispense Refill Last Dose  . cycloSPORINE (RESTASIS) 0.05 % ophthalmic emulsion Place 1 drop into the right eye 2 (two) times daily.   01/11/2015 at Unknown time  . famotidine (PEPCID) 20 MG tablet Take 1 tablet (20 mg total) by mouth 2 (two) times daily. 30 tablet 0 01/11/2015 at Unknown time  . HYDROcodone-acetaminophen (NORCO/VICODIN) 5-325 MG per tablet Take 1 tablet by mouth every 6 (six) hours as needed for moderate pain. 20 tablet 0 01/11/2015 at Unknown time  . Olopatadine HCl 0.7 % SOLN Place 1 drop into the right eye daily.   01/11/2015 at Unknown time  . ondansetron (ZOFRAN) 4 MG tablet Take 1 tablet (4 mg total) by mouth every 6 (six) hours as needed for nausea or vomiting. (Patient taking differently: Take 4 mg by mouth every  8 (eight) hours as needed for nausea or vomiting. ) 4 tablet 0 Past Week at Unknown time  . oxyCODONE-acetaminophen (PERCOCET/ROXICET) 5-325 MG per tablet 1 to 2 tabs PO q6hrs  PRN for pain (Patient taking differently: Take 1-2 tablets by mouth every 6 (six) hours as needed for moderate pain. ) 15 tablet 0 01/11/2015 at Unknown time    ROS Pertinent ROS in HPI   Physical Exam   Blood pressure 93/65, pulse 98, temperature 98 F (36.7 C), temperature source Oral, resp. rate 18, last menstrual period 11/30/2014.  Physical Exam  Constitutional: She is oriented to person, place, and time. She appears well-developed and well-nourished.  HENT:  Head: Normocephalic and atraumatic.  Eyes: EOM are normal.  Neck: Normal range of motion.  Cardiovascular: Normal rate, regular rhythm and normal heart sounds.   Respiratory: Effort normal and breath sounds normal. No respiratory distress.  GI: Soft. Bowel sounds are normal. She exhibits no distension. There is tenderness. There is no rebound and no guarding.  Morbidly obese with substantial abdomen RUQ tenderness  Genitourinary:  Small to mod  amt of pink/red, watery discharge within vaginal canal.  Mucosa is pink.   No CMT.  No adnexal tenderness.   Unable to size uterus or appreciate any potential masses due to habitus.  Musculoskeletal: Normal range of motion.  Neurological: She is alert and oriented to person, place,  and time.  Skin: Skin is warm and dry.  Psychiatric: She has a normal mood and affect.   Results for orders placed or performed during the hospital encounter of 01/12/15 (from the past 24 hour(s))  Urinalysis, Routine w reflex microscopic     Status: Abnormal   Collection Time: 01/12/15 12:00 PM  Result Value Ref Range   Color, Urine YELLOW YELLOW   APPearance CLEAR CLEAR   Specific Gravity, Urine >1.030 (H) 1.005 - 1.030   pH 5.5 5.0 - 8.0   Glucose, UA NEGATIVE NEGATIVE mg/dL   Hgb urine dipstick LARGE (A) NEGATIVE    Bilirubin Urine NEGATIVE NEGATIVE   Ketones, ur NEGATIVE NEGATIVE mg/dL   Protein, ur NEGATIVE NEGATIVE mg/dL   Urobilinogen, UA 0.2 0.0 - 1.0 mg/dL   Nitrite NEGATIVE NEGATIVE   Leukocytes, UA NEGATIVE NEGATIVE  Urine microscopic-add on     Status: Abnormal   Collection Time: 01/12/15 12:00 PM  Result Value Ref Range   Squamous Epithelial / LPF FEW (A) RARE   WBC, UA 0-2 <3 WBC/hpf   RBC / HPF 7-10 <3 RBC/hpf   Bacteria, UA RARE RARE   Urine-Other MUCOUS PRESENT   Pregnancy, urine POC     Status: None   Collection Time: 01/12/15 12:28 PM  Result Value Ref Range   Preg Test, Ur NEGATIVE NEGATIVE  CBC     Status: None   Collection Time: 01/12/15  1:39 PM  Result Value Ref Range   WBC 9.8 4.0 - 10.5 K/uL   RBC 4.38 3.87 - 5.11 MIL/uL   Hemoglobin 12.3 12.0 - 15.0 g/dL   HCT 60.436.9 54.036.0 - 98.146.0 %   MCV 84.2 78.0 - 100.0 fL   MCH 28.1 26.0 - 34.0 pg   MCHC 33.3 30.0 - 36.0 g/dL   RDW 19.113.8 47.811.5 - 29.515.5 %   Platelets 360 150 - 400 K/uL  Wet prep, genital     Status: Abnormal   Collection Time: 01/12/15  1:45 PM  Result Value Ref Range   Yeast Wet Prep HPF POC NONE SEEN NONE SEEN   Trich, Wet Prep NONE SEEN NONE SEEN   Clue Cells Wet Prep HPF POC FEW (A) NONE SEEN   WBC, Wet Prep HPF POC FEW (A) NONE SEEN    MAU Course  Procedures  MDM CBC obtained to check for anemia.  No concern.   Pelvic exam with wet prep/cultures to look for infectious cause of bleeding.  None identified. Ibuprofen 600mg  for pain.  Pt notes improvement with this.   Pt just had imaging done one week ago.  Advised to see GI specialist at that time and this has not yet occurred.    Assessment and Plan   1. Uterine bleeding, dysfunctional   2. Abdominal pain, right upper quadrant      PLAN: Discharge to home Ibuprofen for discomfort - OTC See GYN to consider hormonal therapies/contraception See GI for ab pain.   Patient may return to MAU as needed or if her condition were to change or  worsen   Bertram Denvereague Clark, Nilesh Stegall E 01/12/2015, 1:18 PM

## 2015-01-14 LAB — GC/CHLAMYDIA PROBE AMP (~~LOC~~) NOT AT ARMC
Chlamydia: NEGATIVE
Neisseria Gonorrhea: NEGATIVE

## 2015-01-14 LAB — HIV ANTIBODY (ROUTINE TESTING W REFLEX): HIV Screen 4th Generation wRfx: NONREACTIVE

## 2015-05-16 ENCOUNTER — Inpatient Hospital Stay (HOSPITAL_COMMUNITY)
Admission: AD | Admit: 2015-05-16 | Discharge: 2015-05-16 | Disposition: A | Discharge status: Home / Self Care | Payer: Medicaid Other | Source: Ambulatory Visit | Attending: Obstetrics and Gynecology | Admitting: Obstetrics and Gynecology

## 2015-05-16 ENCOUNTER — Encounter (HOSPITAL_COMMUNITY): Payer: Self-pay | Admitting: *Deleted

## 2015-05-16 ENCOUNTER — Inpatient Hospital Stay (HOSPITAL_COMMUNITY): Payer: Medicaid Other

## 2015-05-16 DIAGNOSIS — N939 Abnormal uterine and vaginal bleeding, unspecified: Secondary | ICD-10-CM

## 2015-05-16 LAB — WET PREP, GENITAL
Clue Cells Wet Prep HPF POC: NONE SEEN
Trich, Wet Prep: NONE SEEN
YEAST WET PREP: NONE SEEN

## 2015-05-16 LAB — URINALYSIS, ROUTINE W REFLEX MICROSCOPIC
Bilirubin Urine: NEGATIVE
Glucose, UA: NEGATIVE mg/dL
Ketones, ur: NEGATIVE mg/dL
Leukocytes, UA: NEGATIVE
Nitrite: NEGATIVE
PROTEIN: NEGATIVE mg/dL
Specific Gravity, Urine: 1.02 (ref 1.005–1.030)
Urobilinogen, UA: 0.2 mg/dL (ref 0.0–1.0)
pH: 6.5 (ref 5.0–8.0)

## 2015-05-16 LAB — CBC
HEMATOCRIT: 35.5 % — AB (ref 36.0–46.0)
HEMOGLOBIN: 11.6 g/dL — AB (ref 12.0–15.0)
MCH: 26.5 pg (ref 26.0–34.0)
MCHC: 32.7 g/dL (ref 30.0–36.0)
MCV: 81.2 fL (ref 78.0–100.0)
Platelets: 364 10*3/uL (ref 150–400)
RBC: 4.37 MIL/uL (ref 3.87–5.11)
RDW: 14.3 % (ref 11.5–15.5)
WBC: 12.3 10*3/uL — AB (ref 4.0–10.5)

## 2015-05-16 LAB — URINE MICROSCOPIC-ADD ON

## 2015-05-16 LAB — POCT PREGNANCY, URINE: PREG TEST UR: NEGATIVE

## 2015-05-16 MED ORDER — IBUPROFEN 600 MG PO TABS
600.0000 mg | ORAL_TABLET | Freq: Once | ORAL | Status: AC
  Administered 2015-05-16: 600 mg via ORAL
  Filled 2015-05-16: qty 1

## 2015-05-16 MED ORDER — NORGESTIMATE-ETH ESTRADIOL 0.25-35 MG-MCG PO TABS
1.0000 | ORAL_TABLET | Freq: Every day | ORAL | Status: DC
Start: 2015-05-16 — End: 2019-01-21

## 2015-05-16 NOTE — Discharge Instructions (Signed)

## 2015-05-16 NOTE — MAU Note (Signed)
Pt stated she has been bleeidng since December. Has been seen a few times for this not given any medication for it. Pt reports the pain has increased and flow has been heavier for the past few days.

## 2015-05-16 NOTE — MAU Provider Note (Signed)
History     CSN: [id number]  Arrival date and time: 05/16/15 1617   First Provider Initiated Contact with Patient 05/16/15 1702      Chief Complaint  Patient presents with  . Vaginal Bleeding   HPI  Julie Banks 19 y.o. G0P0000 presents to MAU with heavy bleeding/prolonged period and feeling lightheaded.  She also notes having mood swings with hot flashes and sweating profusely just out of nowhere.  This has just been for the last 4 days.  The bleeding has been ongoing almost daily since January with a few days here and there of no bleeding.  Sometimes its only been spotting but sometimes heavy.  Since last night it has been heavy, soaking a pad every hour.   She also has abdominal pain that is sharp with burning in the middle very low abdomen (since last night).  It comes and goes.  Laying on side with pressure helps some.  Midol at 1pm somewhat helpful.   OB History    Gravida Para Term Preterm AB TAB SAB Ectopic Multiple Living   0 0 0 0 0 0 0 0 0 0       History reviewed. No pertinent past medical history.  History reviewed. No pertinent past surgical history.  History reviewed. No pertinent family history.  History  Substance Use Topics  . Smoking status: Never Smoker   . Smokeless tobacco: Not on file  . Alcohol Use: No    Allergies: No Known Allergies  Prescriptions prior to admission  Medication Sig Dispense Refill Last Dose  . cycloSPORINE (RESTASIS) 0.05 % ophthalmic emulsion Place 2 drops into both eyes daily.    05/16/2015 at Unknown time  . Diphenhydramine-APAP, sleep, (MIDOL PM PO) Take 2 tablets by mouth every 4 (four) hours as needed (for cramping).   05/16/2015 at 1300  . ondansetron (ZOFRAN) 4 MG tablet Take 1 tablet (4 mg total) by mouth every 6 (six) hours as needed for nausea or vomiting. (Patient taking differently: Take 4 mg by mouth every 8 (eight) hours as needed for nausea or vomiting. ) 4 tablet 0 Past Month at Unknown time  . famotidine (PEPCID) 20  MG tablet Take 1 tablet (20 mg total) by mouth 2 (two) times daily. (Patient not taking: Reported on 05/16/2015) 30 tablet 0 Not Taking at Unknown time  . HYDROcodone-acetaminophen (NORCO/VICODIN) 5-325 MG per tablet Take 1 tablet by mouth every 6 (six) hours as needed for moderate pain. (Patient not taking: Reported on 05/16/2015) 20 tablet 0 Completed Course at Unknown time  . oxyCODONE-acetaminophen (PERCOCET/ROXICET) 5-325 MG per tablet 1 to 2 tabs PO q6hrs  PRN for pain (Patient not taking: Reported on 05/16/2015) 15 tablet 0 Completed Course at Unknown time    ROS Pertinent ROS in HPI.  All other systems are negative.   Physical Exam   Blood pressure 130/76, pulse 100, temperature 99.6 F (37.6 C), resp. rate 18, height 5\' 2"  (1.575 m), weight 363 lb (164.656 kg).  Physical Exam  Constitutional: She is oriented to person, place, and time. She appears well-developed and well-nourished. No distress.  HENT:  Head: Normocephalic and atraumatic.  Eyes: EOM are normal.  Neck: Normal range of motion.  Cardiovascular: Normal rate.   Respiratory: Breath sounds normal.  GI: Soft. There is tenderness.  Tenderness mid lower abdomen Very large abdomen and pannus  Genitourinary:  Small amt of active bleeding from os.  No CMT.  Unable to palpate fundus or appreciate anything in adnexa due  to habitus  Musculoskeletal: Normal range of motion.  Neurological: She is alert and oriented to person, place, and time.  Skin: Skin is warm and dry.  Psychiatric: She has a normal mood and affect.   Recent Results (from the past 2160 hour(s))  Urinalysis, Routine w reflex microscopic (not at [facility])     Status: Abnormal   Collection Time: 05/16/15  4:35 PM  Result Value Ref Range   Color, Urine YELLOW YELLOW   APPearance HAZY (A) CLEAR   Specific Gravity, Urine 1.020 1.005 - 1.030   pH 6.5 5.0 - 8.0   Glucose, UA NEGATIVE NEGATIVE mg/dL   Hgb urine dipstick LARGE (A) NEGATIVE   Bilirubin Urine NEGATIVE  NEGATIVE   Ketones, ur NEGATIVE NEGATIVE mg/dL   Protein, ur NEGATIVE NEGATIVE mg/dL   Urobilinogen, UA 0.2 0.0 - 1.0 mg/dL   Nitrite NEGATIVE NEGATIVE   Leukocytes, UA NEGATIVE NEGATIVE  Urine microscopic-add on     Status: Abnormal   Collection Time: 05/16/15  4:35 PM  Result Value Ref Range   Squamous Epithelial / LPF RARE RARE   WBC, UA 0-2 <3 WBC/hpf   RBC / HPF TOO NUMEROUS TO COUNT <3 RBC/hpf   Bacteria, UA FEW (A) RARE  Pregnancy, urine POC     Status: None   Collection Time: 05/16/15  4:45 PM  Result Value Ref Range   Preg Test, Ur NEGATIVE NEGATIVE    Comment:        THE SENSITIVITY OF THIS METHODOLOGY IS >24 mIU/mL   CBC     Status: Abnormal   Collection Time: 05/16/15  5:18 PM  Result Value Ref Range   WBC 12.3 (H) 4.0 - 10.5 K/uL   RBC 4.37 3.87 - 5.11 MIL/uL   Hemoglobin 11.6 (L) 12.0 - 15.0 g/dL   HCT 16.1 (L) 09.6 - 04.5 %   MCV 81.2 78.0 - 100.0 fL   MCH 26.5 26.0 - 34.0 pg   MCHC 32.7 30.0 - 36.0 g/dL   RDW 40.9 81.1 - 91.4 %   Platelets 364 150 - 400 K/uL  Wet prep, genital     Status: Abnormal   Collection Time: 05/16/15  5:35 PM  Result Value Ref Range   Yeast Wet Prep HPF POC NONE SEEN NONE SEEN   Trich, Wet Prep NONE SEEN NONE SEEN   Clue Cells Wet Prep HPF POC NONE SEEN NONE SEEN   WBC, Wet Prep HPF POC FEW (A) NONE SEEN    Comment: FEW BACTERIA SEEN  US Transvaginal Non-ob  05/16/2015   CLINICAL DATA:  Intermittent vaginal bleeding for 6 months which markedly worsened 05/15/2015.  EXAM: TRANSABDOMINAL AND TRANSVAGINAL ULTRASOUND OF PELVIS  TECHNIQUE: Both transabdominal and transvaginal ultrasound examinations of the pelvis were performed. Transabdominal technique was performed for global imaging of the pelvis including uterus, ovaries, adnexal regions, and pelvic cul-de-sac. It was necessary to proceed with endovaginal exam following the transabdominal exam to visualize the endometrium and ovaries.  COMPARISON:  CT abdomen and pelvis 01/02/2015.  Pelvic ultrasound 04/17/2015.  FINDINGS: Uterus  Measurements: 6.5 x 2.8 x 3.3 cm. No fibroids or other mass visualized.  Endometrium  Thickness: 0.7 cm.  No focal abnormality visualized.  Right ovary  Measurements: 3.6 x 1.7 x 2.7 cm. Normal appearance/no adnexal mass.  Left ovary  Measurements: 4.1 x 2.0 x 2.1 cm. Normal appearance/no adnexal mass.  Other findings  No free fluid.  IMPRESSION: Normal examination.  No finding to explain the patient's symptoms.  Electronically Signed   By: Drusilla Kanner M.D.   On: 05/16/2015 18:47   US Pelvis Complete  05/16/2015   CLINICAL DATA:  Intermittent vaginal bleeding for 6 months which markedly worsened 05/15/2015.  EXAM: TRANSABDOMINAL AND TRANSVAGINAL ULTRASOUND OF PELVIS  TECHNIQUE: Both transabdominal and transvaginal ultrasound examinations of the pelvis were performed. Transabdominal technique was performed for global imaging of the pelvis including uterus, ovaries, adnexal regions, and pelvic cul-de-sac. It was necessary to proceed with endovaginal exam following the transabdominal exam to visualize the endometrium and ovaries.  COMPARISON:  CT abdomen and pelvis 01/02/2015. Pelvic ultrasound 04/17/2015.  FINDINGS: Uterus  Measurements: 6.5 x 2.8 x 3.3 cm. No fibroids or other mass visualized.  Endometrium  Thickness: 0.7 cm.  No focal abnormality visualized.  Right ovary  Measurements: 3.6 x 1.7 x 2.7 cm. Normal appearance/no adnexal mass.  Left ovary  Measurements: 4.1 x 2.0 x 2.1 cm. Normal appearance/no adnexal mass.  Other findings  No free fluid.  IMPRESSION: Normal examination.  No finding to explain the patient's symptoms.   Electronically Signed   By: Drusilla Kanner M.D.   On: 05/16/2015 18:47   MAU Course  Procedures  MDM Pelvic exam done.  Labs reviewed.  U/S reviewed.  Hemodynamically stable.  No sign of infectious cause.  NO structural abnormality noted.      Assessment and Plan  A:  1. Abnormal uterine bleeding (AUB)    P: Discharge  to home OCP - 3 pills x 3 days, 2 pills x the following 3 days, then 1 pill qd until seen Pt to have GYN appt in [facility] Patient may return to MAU as needed or if her condition were to change or worsen   Bertram Denver 05/16/2015, 5:03 PM

## 2015-06-03 ENCOUNTER — Ambulatory Visit: Payer: Medicaid Other | Admitting: Obstetrics & Gynecology

## 2015-06-21 ENCOUNTER — Encounter: Payer: Medicaid Other | Admitting: Obstetrics & Gynecology

## 2015-06-23 NOTE — Progress Notes (Signed)
This encounter was created in error - please disregard.

## 2015-07-05 ENCOUNTER — Ambulatory Visit: Payer: Medicaid Other | Admitting: Family Medicine

## 2016-01-20 ENCOUNTER — Encounter (HOSPITAL_COMMUNITY): Payer: Self-pay | Admitting: Emergency Medicine

## 2016-01-20 ENCOUNTER — Emergency Department (INDEPENDENT_AMBULATORY_CARE_PROVIDER_SITE_OTHER)
Admission: EM | Admit: 2016-01-20 | Discharge: 2016-01-20 | Disposition: A | Discharge status: Home / Self Care | Payer: Self-pay | Source: Home / Self Care | Attending: Family Medicine | Admitting: Family Medicine

## 2016-01-20 DIAGNOSIS — J029 Acute pharyngitis, unspecified: Secondary | ICD-10-CM

## 2016-01-20 MED ORDER — AMOXICILLIN 500 MG PO CAPS: 500.0000 mg | ORAL_CAPSULE | Freq: Three times a day (TID) | ORAL | Status: DC

## 2016-01-20 NOTE — ED Notes (Signed)
Pt has been suffering from a cough and sore throat for three days.  She also reports a fever, nightly at 101.

## 2016-01-20 NOTE — ED Provider Notes (Signed)
CSN: [id number]     Arrival date & time 01/20/16  1650 History   First MD Initiated Contact with Patient 01/20/16 1831     Chief Complaint  Patient presents with  . Cough  . Fever  . Sore Throat   (Consider location/radiation/quality/duration/timing/severity/associated sxs/prior Treatment) HPI History obtained from patient:   LOCATION: throat SEVERITY:4 DURATION:3-4 days CONTEXT:sudden onset after coming home from school Friday QUALITY: Scratchy similar to previous sore throats MODIFYING FACTORS: Saltwater gargles, Chloraseptic spray, honey lemon tea without relief of symptoms. ASSOCIATED SYMPTOMS: Fever TIMING: Constant OCCUPATION: Student  History reviewed. No pertinent past medical history. History reviewed. No pertinent past surgical history. History reviewed. No pertinent family history. Social History  Substance Use Topics  . Smoking status: Never Smoker   . Smokeless tobacco: None  . Alcohol Use: No   OB History    Gravida Para Term Preterm AB TAB SAB Ectopic Multiple Living       Review of Systems Sore throat, fever Allergies  Review of patient's allergies indicates no known allergies.  Home Medications   Prior to Admission medications   Medication Sig Start Date End Date Taking? Authorizing Provider  norgestimate-ethinyl estradiol (ORTHO-CYCLEN, 28,) 0.25-35 MG-MCG tablet Take 1 tablet by mouth daily. Start with 3 pills po x 3 day.  Then 2 pills x 3 days and then 1 pill each day 05/16/15  Yes Duane Boston Clark, PA-C  amoxicillin (AMOXIL) 500 MG capsule Take 1 capsule (500 mg total) by mouth 3 (three) times daily. 01/20/16   Tharon Aquas, PA  cycloSPORINE (RESTASIS) 0.05 % ophthalmic emulsion Place 2 drops into both eyes daily.     Historical Provider, MD  Diphenhydramine-APAP, sleep, (MIDOL PM PO) Take 2 tablets by mouth every 4 (four) hours as needed (for cramping).    Historical Provider, MD   Meds Ordered and Administered this  Visit  Medications - No data to display  BP 152/83 mmHg  Pulse 86  Temp(Src) 98.7 F (37.1 C) (Oral)  Resp 16  SpO2 100%  LMP 12/28/2015 (Exact Date) No data found.   Physical Exam  Constitutional: She is oriented to person, place, and time. She appears well-developed and well-nourished. No distress.  HENT:  Head: Normocephalic and atraumatic.  Right Ear: External ear normal.  Mouth/Throat: Mucous membranes are normal. Oropharyngeal exudate and posterior oropharyngeal erythema present. No tonsillar abscesses.    Pulmonary/Chest: Effort normal and breath sounds normal.  Lymphadenopathy:    She has cervical adenopathy.  Neurological: She is alert and oriented to person, place, and time.  Skin: Skin is warm and dry.  Psychiatric: She has a normal mood and affect. Her behavior is normal.  Nursing note and vitals reviewed.   ED Course  Procedures (including critical care time)  Labs Review Labs Reviewed - No data to display  Imaging Review No results found.   Visual Acuity Review  Right Eye Distance:   Left Eye Distance:   Bilateral Distance:    Right Eye Near:   Left Eye Near:    Bilateral Near:        Prescription for amoxicillin MDM   1. Exudative pharyngitis    Patient is reassured that there are no issues that require transfer to higher level of care at this time.  Patient is advised to continue home symptomatic treatment. Prescription is sent to  pharmacy patient has indicated.  Patient is advised that if there are new  or worsening symptoms or attend the emergency department, or contact primary care provider. Instructions of care provided discharged home in stable condition. Return to work/school note provided.  THIS NOTE WAS GENERATED USING A VOICE RECOGNITION SOFTWARE PROGRAM. ALL REASONABLE EFFORTS  WERE MADE TO PROOFREAD THIS DOCUMENT FOR ACCURACY.     Tharon Aquas, PA 01/20/16 Nicholos Johns

## 2016-01-20 NOTE — Discharge Instructions (Signed)
Strep Throat °Strep throat is a bacterial infection of the throat. Your health care provider may call the infection tonsillitis or pharyngitis, depending on whether there is swelling in the tonsils or at the back of the throat. Strep throat is most common during the cold months of the year in children who are 5-22 years of age, but it can happen during any season in people of any age. This infection is spread from person to person (contagious) through coughing, sneezing, or close contact. °CAUSES °Strep throat is caused by the bacteria called Streptococcus pyogenes. °RISK FACTORS °This condition is more likely to develop in: °· People who spend time in crowded places where the infection can spread easily. °· People who have close contact with someone who has strep throat. °SYMPTOMS °Symptoms of this condition include: °· Fever or chills.   °· Redness, swelling, or pain in the tonsils or throat. °· Pain or difficulty when swallowing. °· White or yellow spots on the tonsils or throat. °· Swollen, tender glands in the neck or under the jaw. °· Red rash all over the body (rare). °DIAGNOSIS °This condition is diagnosed by performing a rapid strep test or by taking a swab of your throat (throat culture test). Results from a rapid strep test are usually ready in a few minutes, but throat culture test results are available after one or two days. °TREATMENT °This condition is treated with antibiotic medicine. °HOME CARE INSTRUCTIONS °Medicines °· Take over-the-counter and prescription medicines only as told by your health care provider. °· Take your antibiotic as told by your health care provider. Do not stop taking the antibiotic even if you start to feel better. °· Have family members who also have a sore throat or fever tested for strep throat. They may need antibiotics if they have the strep infection. °Eating and Drinking °· Do not share food, drinking cups, or personal items that could cause the infection to spread to  other people. °· If swallowing is difficult, try eating soft foods until your sore throat feels better. °· Drink enough fluid to keep your urine clear or pale yellow. °General Instructions °· Gargle with a salt-water mixture 3-4 times per day or as needed. To make a salt-water mixture, completely dissolve ½-1 tsp of salt in 1 cup of warm water. °· Make sure that all household members wash their hands well. °· Get plenty of rest. °· Stay home from school or work until you have been taking antibiotics for 24 hours. °· Keep all follow-up visits as told by your health care provider. This is important. °SEEK MEDICAL CARE IF: °· The glands in your neck continue to get bigger. °· You develop a rash, cough, or earache. °· You cough up a thick liquid that is green, yellow-brown, or bloody. °· You have pain or discomfort that does not get better with medicine. °· Your problems seem to be getting worse rather than better. °· You have a fever. °SEEK IMMEDIATE MEDICAL CARE IF: °· You have new symptoms, such as vomiting, severe headache, stiff or painful neck, chest pain, or shortness of breath. °· You have severe throat pain, drooling, or changes in your voice. °· You have swelling of the neck, or the skin on the neck becomes red and tender. °· You have signs of dehydration, such as fatigue, dry mouth, and decreased urination. °· You become increasingly sleepy, or you cannot wake up completely. °· Your joints become red or painful. °  °This information is not intended to replace   advice given to you by your health care provider. Make sure you discuss any questions you have with your health care provider.   Document Released: 11/06/2000 Document Revised: 07/31/2015 Document Reviewed: 03/04/2015 Elsevier Interactive Patient Education 2016 [employer]  Pharyngitis Pharyngitis is a sore throat (pharynx). There is redness, pain, and swelling of your throat. HOME CARE   Drink enough fluids to keep your pee (urine) clear or  pale yellow.  Only take medicine as told by your doctor.  You may get sick again if you do not take medicine as told. Finish your medicines, even if you start to feel better.  Do not take aspirin.  Rest.  Rinse your mouth (gargle) with salt water ( tsp of salt per 1 qt of water) every 1-2 hours. This will help the pain.  If you are not at risk for choking, you can suck on hard candy or sore throat lozenges. GET HELP IF:  You have large, tender lumps on your neck.  You have a rash.  You cough up green, yellow-brown, or bloody spit. GET HELP RIGHT AWAY IF:   You have a stiff neck.  You drool or cannot swallow liquids.  You throw up (vomit) or are not able to keep medicine or liquids down.  You have very bad pain that does not go away with medicine.  You have problems breathing (not from a stuffy nose). MAKE SURE YOU:   Understand these instructions.  Will watch your condition.  Will get help right away if you are not doing well or get worse.   This information is not intended to replace advice given to you by your health care provider. Make sure you discuss any questions you have with your health care provider.   Document Released: 04/27/2008 Document Revised: 08/30/2013 Document Reviewed: 07/17/2013 Elsevier Interactive Patient Education [employer]

## 2016-07-30 ENCOUNTER — Other Ambulatory Visit: Payer: Self-pay | Admitting: Physician Assistant

## 2018-07-08 ENCOUNTER — Encounter (HOSPITAL_COMMUNITY): Payer: Self-pay

## 2018-07-08 ENCOUNTER — Ambulatory Visit (HOSPITAL_COMMUNITY)
Admission: EM | Admit: 2018-07-08 | Discharge: 2018-07-08 | Disposition: A | Discharge status: Home / Self Care | Payer: Self-pay | Attending: Family Medicine | Admitting: Family Medicine

## 2018-07-08 DIAGNOSIS — R110 Nausea: Secondary | ICD-10-CM

## 2018-07-08 DIAGNOSIS — R51 Headache: Secondary | ICD-10-CM

## 2018-07-08 DIAGNOSIS — R519 Headache, unspecified: Secondary | ICD-10-CM

## 2018-07-08 DIAGNOSIS — R42 Dizziness and giddiness: Secondary | ICD-10-CM

## 2018-07-08 MED ORDER — METOCLOPRAMIDE HCL 5 MG/ML IJ SOLN
INTRAMUSCULAR | Status: AC
  Filled 2018-07-08: qty 2

## 2018-07-08 MED ORDER — DEXAMETHASONE SODIUM PHOSPHATE 10 MG/ML IJ SOLN
INTRAMUSCULAR | Status: AC
  Filled 2018-07-08: qty 1

## 2018-07-08 MED ORDER — FLUTICASONE PROPIONATE 50 MCG/ACT NA SUSP: 2.0000 | Freq: Every day | NASAL | 0 refills | Status: DC

## 2018-07-08 MED ORDER — KETOROLAC TROMETHAMINE 30 MG/ML IJ SOLN
30.0000 mg | Freq: Once | INTRAMUSCULAR | Status: AC
  Administered 2018-07-08: 30 mg via INTRAVENOUS

## 2018-07-08 MED ORDER — DEXAMETHASONE SODIUM PHOSPHATE 10 MG/ML IJ SOLN
10.0000 mg | Freq: Once | INTRAMUSCULAR | Status: AC
  Administered 2018-07-08: 10 mg via INTRAVENOUS

## 2018-07-08 MED ORDER — KETOROLAC TROMETHAMINE 30 MG/ML IJ SOLN
INTRAMUSCULAR | Status: AC
  Filled 2018-07-08: qty 1

## 2018-07-08 MED ORDER — SODIUM CHLORIDE 0.9 % IV BOLUS
1000.0000 mL | Freq: Once | INTRAVENOUS | Status: AC
  Administered 2018-07-08: 1000 mL via INTRAVENOUS

## 2018-07-08 MED ORDER — METOCLOPRAMIDE HCL 5 MG/ML IJ SOLN
5.0000 mg | Freq: Once | INTRAMUSCULAR | Status: AC
  Administered 2018-07-08: 5 mg via INTRAVENOUS

## 2018-07-08 MED ORDER — PREDNISONE 50 MG PO TABS: 50.0000 mg | ORAL_TABLET | Freq: Every day | ORAL | 0 refills | Status: AC

## 2018-07-08 NOTE — ED Triage Notes (Signed)
Pt presents with ongoing headache with no relief with medication.  Pt also complains of blurry vision, dizziness, sensitivity to light and nausea with the onset of the headache.

## 2018-07-08 NOTE — Discharge Instructions (Addendum)
Toradol, reglan, decadron given in office today. Given sinus pressure, will start prednisone and flonase. You can also take over the counter zyrtec to help with nasal congestion/drainage. You can use over the counter nasal saline rinse such as neti pot for nasal congestion. Keep hydrated, your urine should be clear to pale yellow in color. Follow up with neurology for further evaluation if symptoms not improving. If experiencing worsening of symptoms, worsening headache/blurry vision, nausea/vomiting, confusion/altered mental status, dizziness, weakness, passing out, imbalance, go to the emergency department for further evaluation.

## 2018-07-08 NOTE — ED Provider Notes (Signed)
[facility]    CSN: [id number] Arrival date & time: 07/08/18  1548     History   Chief Complaint Chief Complaint  Patient presents with  . Headache    HPI Julie Banks is a 22 y.o. female.   22 year old female comes in for 3-week history of intermittent headache.  Current episode has lasted for the past week.  Headache is frontal, pounding in nature.  States that times there is also tightness to the back of the head.  She has photophobia, phonophobia.  Nausea without vomiting.  Denies fever, chills, night sweats.  Has had rhinorrhea and nasal congestion for the past few days, denies cough, sore throat.  Denies weakness, lightheadedness, syncope.  Does have intermittent dizziness that is worse with ambulation.  Has been taking Excedrin without relief.  Never smoker.     History reviewed. No pertinent past medical history.  There are no active problems to display for this patient.   History reviewed. No pertinent surgical history.  OB History    Gravida  0   Para  0   Term  0   Preterm  0   AB  0   Living  0     SAB  0   TAB  0   Ectopic  0   Multiple  0   Live Births               Home Medications    Prior to Admission medications   Medication Sig Start Date End Date Taking? Authorizing Provider  cycloSPORINE (RESTASIS) 0.05 % ophthalmic emulsion Place 2 drops into both eyes daily.     [provider]  Diphenhydramine-APAP, sleep, (MIDOL PM PO) Take 2 tablets by mouth every 4 (four) hours as needed (for cramping).    [provider]  fluticasone (FLONASE) 50 MCG/ACT nasal spray Place 2 sprays into both nostrils daily. 07/08/18   Cathie HoopsYu, Jaleigh Mccroskey V, PA-C  norgestimate-ethinyl estradiol (ORTHO-CYCLEN, 28,) 0.25-35 MG-MCG tablet Take 1 tablet by mouth daily. Start with 3 pills po x 3 day.  Then 2 pills x 3 days and then 1 pill each day 05/16/15   Glyn Adeeague Clark, Scot JunKaren E, PA-C  predniSONE (DELTASONE) 50 MG tablet Take 1 tablet (50 mg  total) by mouth daily for 5 days. 07/08/18 07/13/18  Belinda FisherYu, Maziah Smola V, PA-C    Family History History reviewed. No pertinent family history.  Social History Social History   Tobacco Use  . Smoking status: Never Smoker  Substance Use Topics  . Alcohol use: No  . Drug use: No     Allergies   Patient has no known allergies.   Review of Systems Review of Systems  Reason unable to perform ROS: See HPI as above.     Physical Exam Triage Vital Signs ED Triage Vitals [07/08/18 1611]  Enc Vitals Group     BP 122/71     Pulse Rate 90     Resp 20     Temp 98 F (36.7 C)     Temp Source Temporal     SpO2 100 %     Weight      Height      Head Circumference      Peak Flow      Pain Score      Pain Loc      Pain Edu?      Excl. in GC?    No data found.  Updated Vital Signs BP 122/71 (BP  Location: Left Arm)   Pulse 90   Temp 98 F (36.7 C) (Temporal)   Resp 20   LMP  (LMP Unknown)   SpO2 100%   Physical Exam  Constitutional: She is oriented to person, place, and time. She appears well-developed and well-nourished.  Non-toxic appearance. She does not appear ill. No distress.  HENT:  Head: Normocephalic and atraumatic.  Right Ear: Tympanic membrane, external ear and ear canal normal. Tympanic membrane is not erythematous and not bulging.  Left Ear: Tympanic membrane, external ear and ear canal normal. Tympanic membrane is not erythematous and not bulging.  Nose: Right sinus exhibits frontal sinus tenderness. Right sinus exhibits no maxillary sinus tenderness. Left sinus exhibits frontal sinus tenderness. Left sinus exhibits no maxillary sinus tenderness.  Mouth/Throat: Uvula is midline, oropharynx is clear and moist and mucous membranes are normal.  Eyes: Pupils are equal, round, and reactive to light. Conjunctivae and EOM are normal.  Neck: Normal range of motion. Neck supple. No spinous process tenderness and no muscular tenderness present. Normal range of motion present.    Cardiovascular: Normal rate, regular rhythm and normal heart sounds. Exam reveals no gallop and no friction rub.  No murmur heard. Pulmonary/Chest: Effort normal and breath sounds normal. No accessory muscle usage or stridor. No respiratory distress. She has no decreased breath sounds. She has no wheezes. She has no rhonchi. She has no rales.  Lymphadenopathy:    She has no cervical adenopathy.  Neurological: She is alert and oriented to person, place, and time. She has normal strength. She is not disoriented. No cranial nerve deficit or sensory deficit. She displays a negative Romberg sign. Coordination and gait normal. GCS eye subscore is 4. GCS verbal subscore is 5. GCS motor subscore is 6.  Normal finger to nose, rapid movement. Able to ambulate on own without difficulty.   Skin: Skin is warm and dry.  Psychiatric: She has a normal mood and affect. Her behavior is normal. Judgment normal.   UC Treatments / Results  Labs (all labs ordered are listed, but only abnormal results are displayed) Labs Reviewed - No data to display  EKG None  Radiology No results found.  Procedures Procedures (including critical care time)  Medications Ordered in UC Medications  sodium chloride 0.9 % bolus 1,000 mL (1,000 mLs Intravenous New Bag/Given 07/08/18 1654)  ketorolac (TORADOL) 30 MG/ML injection 30 mg (30 mg Intravenous Given 07/08/18 1654)  dexamethasone (DECADRON) injection 10 mg (10 mg Intravenous Given 07/08/18 1655)  metoCLOPramide (REGLAN) injection 5 mg (5 mg Intravenous Given 07/08/18 1655)    Initial Impression / Assessment and Plan / UC Course  I have reviewed the triage vital signs and the nursing notes.  Pertinent labs & imaging results that were available during my care of the patient were reviewed by me and considered in my medical decision making (see chart for details).    Patient with improvement of symptoms after IV fluids, toradol, decadron, reglan. No alarming signs on  exam. Given sinus tenderness, nasal congestion, rhinorrhea, will cover for sinus pressure with flonase, prednisone. Discussed other possible causes of headache, including pseudotumor cerebri, migraine, will have patient follow up with neurology for further evaluation if symptoms not improving.  Final Clinical Impressions(s) / UC Diagnoses   Final diagnoses:  Acute intractable headache, unspecified headache type    ED Prescriptions    Medication Sig Dispense Auth. Provider   predniSONE (DELTASONE) 50 MG tablet Take 1 tablet (50 mg total) by mouth daily for  5 days. 5 tablet Maralee Higuchi V, PA-C   fluticasone (FLONASE) 50 MCG/ACT nasal spray Place 2 sprays into both nostrils daily. 1 g Threasa AlphaYu, Silus Lanzo V, PA-C        Lars Jeziorski V, [place] JerseyPA-C 07/08/18 1747

## 2018-12-15 ENCOUNTER — Ambulatory Visit (HOSPITAL_COMMUNITY)
Admission: EM | Admit: 2018-12-15 | Discharge: 2018-12-15 | Disposition: A | Discharge status: Home / Self Care | Payer: Self-pay | Attending: Family Medicine | Admitting: Family Medicine

## 2018-12-15 ENCOUNTER — Other Ambulatory Visit: Payer: Self-pay

## 2018-12-15 ENCOUNTER — Encounter (HOSPITAL_COMMUNITY): Payer: Self-pay | Admitting: Emergency Medicine

## 2018-12-15 ENCOUNTER — Ambulatory Visit (INDEPENDENT_AMBULATORY_CARE_PROVIDER_SITE_OTHER): Payer: Self-pay

## 2018-12-15 DIAGNOSIS — B349 Viral infection, unspecified: Secondary | ICD-10-CM | POA: Insufficient documentation

## 2018-12-15 DIAGNOSIS — R509 Fever, unspecified: Secondary | ICD-10-CM

## 2018-12-15 DIAGNOSIS — R51 Headache: Secondary | ICD-10-CM

## 2018-12-15 DIAGNOSIS — J029 Acute pharyngitis, unspecified: Secondary | ICD-10-CM

## 2018-12-15 DIAGNOSIS — M791 Myalgia, unspecified site: Secondary | ICD-10-CM

## 2018-12-15 DIAGNOSIS — R05 Cough: Secondary | ICD-10-CM

## 2018-12-15 DIAGNOSIS — R110 Nausea: Secondary | ICD-10-CM

## 2018-12-15 LAB — POCT RAPID STREP A: STREPTOCOCCUS, GROUP A SCREEN (DIRECT): NEGATIVE

## 2018-12-15 NOTE — ED Triage Notes (Signed)
PT reports fever, headache, bodyaches, vomiting for 1 week. Pt has vomited 2x in last 24 hours.

## 2018-12-15 NOTE — Discharge Instructions (Addendum)
Your x-ray was negative for any pneumonia or bronchitis This is most likely a viral upper respiratory infection You can do over-the-counter Mucinex for cough and congestion Tylenol or ibuprofen for fever and body aches Follow up as needed for continued or worsening symptoms

## 2018-12-16 NOTE — ED Provider Notes (Signed)
[facility]    CSN: [id number] Arrival date & time: 12/15/18  1142     History   Chief Complaint Chief Complaint  Patient presents with  . Influenza    HPI Julie Banks is a 23 y.o. female.   Patient is a 23 year old female that presents with subjective fever, headache, myalgias, vomiting x1 week.  Her symptoms have been waxing and waning.  She vomited 2 times in the past 24 hours.  She denies any associated diarrhea.  She has had some mild cough and sore throat.  She has been coughing up mucus.describes it as green and yellow at times. Denies any chest pain, SOB. She has been taking Thera flu for her symptoms with some relief.  She denies any recent traveling or recent sick contacts.  ROS per HPI      History reviewed. No pertinent past medical history.  There are no active problems to display for this patient.   History reviewed. No pertinent surgical history.  OB History    Gravida  0   Para  0   Term  0   Preterm  0   AB  0   Living  0     SAB  0   TAB  0   Ectopic  0   Multiple  0   Live Births               Home Medications    Prior to Admission medications   Medication Sig Start Date End Date Taking? Authorizing Provider  cycloSPORINE (RESTASIS) 0.05 % ophthalmic emulsion Place 2 drops into both eyes daily.     [provider]  Diphenhydramine-APAP, sleep, (MIDOL PM PO) Take 2 tablets by mouth every 4 (four) hours as needed (for cramping).    [provider]  fluticasone (FLONASE) 50 MCG/ACT nasal spray Place 2 sprays into both nostrils daily. 07/08/18   Cathie HoopsYu, Amy V, PA-C  norgestimate-ethinyl estradiol (ORTHO-CYCLEN, 28,) 0.25-35 MG-MCG tablet Take 1 tablet by mouth daily. Start with 3 pills po x 3 day.  Then 2 pills x 3 days and then 1 pill each day 05/16/15   Glyn Adeeague Clark, Scot JunKaren E, PA-C    Family History No family history on file.  Social History Social History   Tobacco Use  . Smoking status: Never  Smoker  Substance Use Topics  . Alcohol use: No  . Drug use: No     Allergies   Patient has no known allergies.   Review of Systems Review of Systems   Physical Exam Triage Vital Signs ED Triage Vitals  Enc Vitals Group     BP 12/15/18 1239 (!) 142/96     Pulse Rate 12/15/18 1239 (!) 113     Resp 12/15/18 1239 16     Temp 12/15/18 1239 98.4 F (36.9 C)     Temp Source 12/15/18 1239 Oral     SpO2 12/15/18 1239 100 %     Weight --      Height --      Head Circumference --      Peak Flow --      Pain Score 12/15/18 1238 7     Pain Loc --      Pain Edu? --      Excl. in GC? --    No data found.  Updated Vital Signs BP (!) 142/96   Pulse (!) 113   Temp 98.4 F (36.9 C) (Oral)   Resp 16  SpO2 100%   Visual Acuity Right Eye Distance:   Left Eye Distance:   Bilateral Distance:    Right Eye Near:   Left Eye Near:    Bilateral Near:     Physical Exam Vitals signs and nursing note reviewed.  Constitutional:      General: She is not in acute distress.    Appearance: She is well-developed. She is obese. She is not ill-appearing or toxic-appearing.  HENT:     Head: Normocephalic and atraumatic.     Right Ear: Tympanic membrane and ear canal normal.     Left Ear: Tympanic membrane and ear canal normal.     Nose: Congestion present.     Mouth/Throat:     Pharynx: Posterior oropharyngeal erythema present.     Tonsils: Swelling: 1+ on the right. 1+ on the left.  Eyes:     Conjunctiva/sclera: Conjunctivae normal.  Neck:     Musculoskeletal: Normal range of motion and neck supple. No neck rigidity or muscular tenderness.  Cardiovascular:     Rate and Rhythm: Normal rate and regular rhythm.     Heart sounds: No murmur.  Pulmonary:     Effort: Pulmonary effort is normal. No respiratory distress.     Breath sounds: Normal breath sounds.  Abdominal:     Palpations: Abdomen is soft.     Tenderness: There is no abdominal tenderness.  Musculoskeletal: Normal  range of motion.  Lymphadenopathy:     Cervical: No cervical adenopathy.  Skin:    General: Skin is warm and dry.     Findings: No rash.  Neurological:     Mental Status: She is alert.  Psychiatric:        Mood and Affect: Mood normal.      UC Treatments / Results  Labs (all labs ordered are listed, but only abnormal results are displayed) Labs Reviewed  CULTURE, GROUP A STREP [facility])  POCT RAPID STREP A    EKG None  Radiology Dg Chest 2 View  Result Date: 12/15/2018 CLINICAL DATA:  Cough and chest pain EXAM: CHEST - 2 VIEW COMPARISON:  November 18, 2013 FINDINGS: Lungs are clear. Heart size and pulmonary vascularity are normal. No adenopathy. No pneumothorax. No bone lesions. IMPRESSION: No edema or consolidation. Electronically Signed   By: Bretta Bang III M.D.   On: 12/15/2018 14:09    Procedures Procedures (including critical care time)  Medications Ordered in UC Medications - No data to display  Initial Impression / Assessment and Plan / UC Course  I have reviewed the triage vital signs and the nursing notes.  Pertinent labs & imaging results that were available during my care of the patient were reviewed by me and considered in my medical decision making (see chart for details).     Patient is a 23 year old female with flulike symptoms This is been going on for approximately 1 week. Rapid strep test here was negative X-ray did not show any pneumonia or bronchitis Most likely her symptoms are viral. There is no need to treat with Tamiflu based on length of symptoms She can do over-the-counter medication for cough, fever and body aches Follow up as needed for continued or worsening symptoms  Final Clinical Impressions(s) / UC Diagnoses   Final diagnoses:  Viral syndrome     Discharge Instructions     Your x-ray was negative for any pneumonia or bronchitis This is most likely a viral upper respiratory infection You can do over-the-counter  Mucinex for cough  and congestion Tylenol or ibuprofen for fever and body aches Follow up as needed for continued or worsening symptoms    ED Prescriptions    None     Controlled Substance Prescriptions [place] Controlled Substance Registry consulted? no   Janace ArisBast, Michaelia Beilfuss A, NP 12/16/18 [phone]

## 2018-12-18 LAB — CULTURE, GROUP A STREP (THRC)

## 2019-01-21 ENCOUNTER — Ambulatory Visit (HOSPITAL_COMMUNITY)
Admission: EM | Admit: 2019-01-21 | Discharge: 2019-01-21 | Disposition: A | Discharge status: Home / Self Care | Payer: Medicaid Other | Attending: Family Medicine | Admitting: Family Medicine

## 2019-01-21 ENCOUNTER — Encounter (HOSPITAL_COMMUNITY): Payer: Self-pay | Admitting: Emergency Medicine

## 2019-01-21 ENCOUNTER — Other Ambulatory Visit: Payer: Self-pay

## 2019-01-21 ENCOUNTER — Encounter (HOSPITAL_COMMUNITY): Payer: Self-pay

## 2019-01-21 ENCOUNTER — Emergency Department (HOSPITAL_COMMUNITY)
Admission: EM | Admit: 2019-01-21 | Discharge: 2019-01-21 | Disposition: A | Discharge status: Home / Self Care | Payer: Medicaid Other | Attending: Emergency Medicine | Admitting: Emergency Medicine

## 2019-01-21 ENCOUNTER — Emergency Department (HOSPITAL_COMMUNITY): Payer: Medicaid Other

## 2019-01-21 DIAGNOSIS — R739 Hyperglycemia, unspecified: Secondary | ICD-10-CM | POA: Insufficient documentation

## 2019-01-21 DIAGNOSIS — R220 Localized swelling, mass and lump, head: Secondary | ICD-10-CM | POA: Insufficient documentation

## 2019-01-21 DIAGNOSIS — M7989 Other specified soft tissue disorders: Secondary | ICD-10-CM | POA: Insufficient documentation

## 2019-01-21 DIAGNOSIS — R2233 Localized swelling, mass and lump, upper limb, bilateral: Secondary | ICD-10-CM

## 2019-01-21 DIAGNOSIS — R062 Wheezing: Secondary | ICD-10-CM | POA: Insufficient documentation

## 2019-01-21 DIAGNOSIS — N921 Excessive and frequent menstruation with irregular cycle: Secondary | ICD-10-CM

## 2019-01-21 DIAGNOSIS — R21 Rash and other nonspecific skin eruption: Secondary | ICD-10-CM | POA: Insufficient documentation

## 2019-01-21 DIAGNOSIS — D649 Anemia, unspecified: Secondary | ICD-10-CM | POA: Insufficient documentation

## 2019-01-21 DIAGNOSIS — R112 Nausea with vomiting, unspecified: Secondary | ICD-10-CM | POA: Insufficient documentation

## 2019-01-21 LAB — COMPREHENSIVE METABOLIC PANEL
ALK PHOS: 104 U/L (ref 38–126)
ALT: 44 U/L (ref 0–44)
AST: 37 U/L (ref 15–41)
Albumin: 3.2 g/dL — ABNORMAL LOW (ref 3.5–5.0)
Anion gap: 13 (ref 5–15)
BUN: 8 mg/dL (ref 6–20)
CALCIUM: 8.4 mg/dL — AB (ref 8.9–10.3)
CO2: 23 mmol/L (ref 22–32)
CREATININE: 0.7 mg/dL (ref 0.44–1.00)
Chloride: 102 mmol/L (ref 98–111)
GFR calc non Af Amer: >60 mL/min (ref >60)
Glucose, Bld: 199 mg/dL — ABNORMAL HIGH (ref 70–99)
Potassium: 3.6 mmol/L (ref 3.5–5.1)
Sodium: 138 mmol/L (ref 135–145)
TOTAL PROTEIN: 7.2 g/dL (ref 6.5–8.1)
Total Bilirubin: 0.4 mg/dL (ref 0.3–1.2)

## 2019-01-21 LAB — URINALYSIS, ROUTINE W REFLEX MICROSCOPIC
BILIRUBIN URINE: NEGATIVE
Glucose, UA: NEGATIVE mg/dL
Ketones, ur: NEGATIVE mg/dL
LEUKOCYTE UA: NEGATIVE
Nitrite: NEGATIVE
Protein, ur: NEGATIVE mg/dL
RBC / HPF: >50 RBC/hpf — ABNORMAL HIGH (ref 0–5)
Specific Gravity, Urine: 1.021 (ref 1.005–1.030)
pH: 5 (ref 5.0–8.0)

## 2019-01-21 LAB — CBC WITH DIFFERENTIAL/PLATELET
Abs Immature Granulocytes: 0.05 10*3/uL (ref 0.00–0.07)
Basophils Absolute: 0 10*3/uL (ref 0.0–0.1)
Basophils Relative: 0 %
Eosinophils Absolute: 0.1 10*3/uL (ref 0.0–0.5)
Eosinophils Relative: 1 %
HCT: 30.8 % — ABNORMAL LOW (ref 36.0–46.0)
Hemoglobin: 8.9 g/dL — ABNORMAL LOW (ref 12.0–15.0)
Immature Granulocytes: 0 %
LYMPHS ABS: 2.5 10*3/uL (ref 0.7–4.0)
Lymphocytes Relative: 21 %
MCH: 20.7 pg — AB (ref 26.0–34.0)
MCHC: 28.9 g/dL — ABNORMAL LOW (ref 30.0–36.0)
MCV: 71.6 fL — AB (ref 80.0–100.0)
MONO ABS: 0.4 10*3/uL (ref 0.1–1.0)
Monocytes Relative: 3 %
NEUTROS ABS: 9.1 10*3/uL — AB (ref 1.7–7.7)
Neutrophils Relative %: 75 %
PLATELETS: 547 10*3/uL — AB (ref 150–400)
RBC: 4.3 MIL/uL (ref 3.87–5.11)
RDW: 17.2 % — ABNORMAL HIGH (ref 11.5–15.5)
WBC: 12.2 10*3/uL — ABNORMAL HIGH (ref 4.0–10.5)
nRBC: 0 % (ref 0.0–0.2)

## 2019-01-21 LAB — LIPASE, BLOOD: LIPASE: 22 U/L (ref 11–51)

## 2019-01-21 LAB — I-STAT BETA HCG BLOOD, ED (MC, WL, AP ONLY): I-stat hCG, quantitative: <5 m[IU]/mL (ref <5)

## 2019-01-21 MED ORDER — SODIUM CHLORIDE 0.9 % IV BOLUS
500.0000 mL | Freq: Once | INTRAVENOUS | Status: AC
  Administered 2019-01-21: 500 mL via INTRAVENOUS

## 2019-01-21 MED ORDER — DIPHENHYDRAMINE HCL 25 MG PO TABS: 25.0000 mg | ORAL_TABLET | Freq: Four times a day (QID) | ORAL | 0 refills | Status: DC | PRN

## 2019-01-21 NOTE — Discharge Instructions (Addendum)
Blood work obtained.  We will follow up with you regarding abnormal results.  Benadryl prescribed.  Take as directed for swelling and itching.  DO NOT TAKE prior to driving or operating heavy machinery, this medication may make you drowsy.   Follow up with PCP if symptoms persists.  I have including Joaquin Courts FNP information.  Please call her office first thing Monday to set up an appointment to establish care Return or go to the ED if you have any new or worsening symptoms such as fever, chills, chest pain, throat closing, throat tightness, inability to swallow liquids or saliva, abdominal pain, nausea, vomiting, changes in bowel or bladder habits, etc..Marland Kitchen

## 2019-01-21 NOTE — Discharge Instructions (Signed)
Please call.

## 2019-01-21 NOTE — ED Provider Notes (Signed)
[facility] EMERGENCY DEPARTMENT Provider Note   CSN: [id number] Arrival date & time: 01/21/19  1608    History   Chief Complaint No chief complaint on file.   HPI Julie Banks is a 23 y.o. female.     Pt presents to the ED today primarily for abnormal lab value of WBC 12.2 and H/H 8.9/30.8. Pt was seen at [facility] earlier today due to multiple complaints including wheezing and URI symptoms x 1 month, and facial and hand swelling and intermittent rash x 2 days.  She was previously seen about 1 month ago for same, tested for flu and strep throat which were both negative. Pt also had a CXR done at that time with no acute findings. She reports that for the past month she has been having these same symptoms, unresolved. She returned to UC today due to the bilateral hand swelling with a burning sensation and a small papule to her right index finger. Pt's physical exam was unremarkable, she was told to take benadryl, and discharged home. She had a CBC done at that time and told they would call if results were abnormal. Once she received call about leukocytosis she was told to come to the ED for further eval.      No past medical history on file.  There are no active problems to display for this patient.   No past surgical history on file.   OB History    Gravida  0   Para  0   Term  0   Preterm  0   AB  0   Living  0     SAB  0   TAB  0   Ectopic  0   Multiple  0   Live Births               Home Medications    Prior to Admission medications   Medication Sig Start Date End Date Taking? Authorizing Provider  cycloSPORINE (RESTASIS) 0.05 % ophthalmic emulsion Place 2 drops into both eyes daily.     [provider]  diphenhydrAMINE (BENADRYL) 25 MG tablet Take 1 tablet (25 mg total) by mouth every 6 (six) hours as needed. 01/21/19   [place], [place], PA-C  Diphenhydramine-APAP, sleep, (MIDOL PM PO) Take 2 tablets by mouth  every 4 (four) hours as needed (for cramping).    [provider]    Family History No family history on file.  Social History Social History   Tobacco Use  . Smoking status: Never Smoker  Substance Use Topics  . Alcohol use: No  . Drug use: No     Allergies   Patient has no known allergies.   Review of Systems Review of Systems  Constitutional: Positive for diaphoresis and fever. Negative for chills.  HENT: Positive for congestion and sore throat. Negative for ear pain and trouble swallowing.   Eyes: Negative for pain.  Respiratory: Positive for cough and wheezing. Negative for shortness of breath.   Cardiovascular: Negative for chest pain.  Gastrointestinal: Positive for nausea and vomiting. Negative for abdominal pain, blood in stool and constipation.  Genitourinary: Positive for menstrual problem.  Musculoskeletal: Positive for joint swelling (Bilateral hand swelling).  Skin:       Small papule to right index finger   Neurological: Negative for headaches.     Physical Exam Updated Vital Signs BP (!) 154/93 (BP Location: Right Wrist)   Pulse (!) 118   Temp  98.4 F (36.9 C) (Oral)   Resp 18   LMP 01/21/2019   SpO2 97%   Physical Exam Vitals signs and nursing note reviewed.  Constitutional:      General: She is not in acute distress.    Appearance: She is well-developed. She is obese.  HENT:     Head: Normocephalic and atraumatic.     Right Ear: Tympanic membrane normal.     Left Ear: Tympanic membrane normal.     Nose: Nose normal.     Mouth/Throat:     Mouth: Mucous membranes are moist.     Pharynx: No oropharyngeal exudate or posterior oropharyngeal erythema.  Eyes:     Conjunctiva/sclera: Conjunctivae normal.     Pupils: Pupils are equal, round, and reactive to light.  Neck:     Musculoskeletal: Normal range of motion and neck supple.  Cardiovascular:     Rate and Rhythm: Regular rhythm. Tachycardia present.     Heart sounds: No  murmur.  Pulmonary:     Effort: Pulmonary effort is normal. No respiratory distress.     Breath sounds: Normal breath sounds. No wheezing.  Abdominal:     Palpations: Abdomen is soft.     Tenderness: There is no abdominal tenderness.  Musculoskeletal: Normal range of motion.        General: No swelling.     Comments: No obvious swelling to hands despite pt stating they are swollen  Lymphadenopathy:     Cervical: No cervical adenopathy.  Skin:    General: Skin is warm and dry.  Neurological:     Mental Status: She is alert.      ED Treatments / Results  Labs (all labs ordered are listed, but only abnormal results are displayed) Labs Reviewed  COMPREHENSIVE METABOLIC PANEL  URINALYSIS, ROUTINE W REFLEX MICROSCOPIC  LIPASE, BLOOD  I-STAT BETA HCG BLOOD, ED ([facility], [facility], [facility] ONLY)    EKG None  Radiology No results found.  Procedures Procedures (including critical care time)  Medications Ordered in ED Medications  sodium chloride 0.9 % bolus 500 mL (has no administration in time range)     Initial Impression / Assessment and Plan / ED Course  I have reviewed the triage vital signs and the nursing notes.  Pertinent labs & imaging results that were available during my care of the patient were reviewed by me and considered in my medical decision making (see chart for details).  Clinical Course as of Feb 29 1906  Sat Jan 21, 2019  1905 Bacteria, UA(!): RARE [MV]    Clinical Course User Index [MV] Tanda RockersVenter, Vonna Brabson, PA-C   Pt presents for abnormal lab value of leukocytosis 12.2 after going to UC today for URI like symptoms x 1 month. H/H also decreased to 8.9/30.8; not experiencing dizziness/lightheadedness; denies blood in stool; endorses heavy menstrual periods that last > 2 weeks; has been evaluated for anemia in the past and used to be on iron supplements; currently takes a multivitamin but unsure if it contains Fe. Pt has followed up with OBGYN in the past regarding heavy  menstrual flow; was put on OCPs which only decreased length mildly; discontinued taking them and has not been back to see OBGYN.   Pt complaining of bilateral swelling in hands with a burning sensation. Pt does not appear volume overloaded at this time. No change in soaps, detergents, or any hygiene products. She also has had 1 month of URI like symptoms; LCTAB; no posterior oropharyngeal edema or erythema; TMs  clear; no cervical lymphadenopathy. Will order baseline labs given pt's elevated leukocytosis today as well as CXR to rule out infectious process although low suspicion for such.   CMP with elevated glucose of 199. Calcium 8.4 and albumin 3.2. CXR negative for any acute process. Awaiting U/A results although given clinical course low suspicion for abnormal results. Pt likely experiencing symptoms from chronic issues that should be addressed with PCP.   U/A with large Hgb; pt currently on menstrual period.   Dr. Jacqulyn Bath evaluated pt as well; agrees that pt is having multiple chronic issues that need to be discussed with PCP and OBGYN. Discussed lab findings and CXR with patient; she is still unsure why she is having a burning sensation in her hands; could possibly be neuropathic pain as pt has hyperglycemia on CMP. Will give pt instructions to call OBGYN Donnella Bi who she has seen in the past. Also gave pt information on  and Wellness to establish care with a PCP - advised to follow up regarding blood work, hyperglycemia.     Final Clinical Impressions(s) / ED Diagnoses   Final diagnoses:  None    ED Discharge Orders    None       Tanda Rockers, PA-C 01/21/19 2047    Maia Plan, MD 01/21/19 [phone]

## 2019-01-21 NOTE — ED Notes (Signed)
Patient verbalizes understanding of discharge instructions. Opportunity for questioning and answers were provided. Armband removed by staff, pt discharged from ED.  

## 2019-01-21 NOTE — ED Provider Notes (Addendum)
[facility]   [id number] 01/21/19 Arrival Time: 1138   CC: Multiple complaints  SUBJECTIVE: History from: patient.  Julie Banks is a 23 y.o. female who presents with multiple symptoms including wheezing and URI symptoms x 1 month, and facial and hand swelling and intermittent rash x 2 days.    Was seen at the end of January for URI symptoms, negative chest x-ray at that time, and has tried supportive care without relief.  Reports close sick contacts at home.    Denies known trigger, changes in bath or soap products, close contacts with similar rash, eating anything out of the ordinary or changes in medications.  Has tried abreva and soaking her hands in cold water without relief.  Describes rash as intermittent and last for a few hours at time.  Localizes to hands, around lips, and inside of mouth.  Describes as burning and itching.  Denies previous symptoms in the past.    Complains of right ear pain, sore throat, fatigue, nausea, 1 episode of vomiting.   Denies fever, chills, erythema, redness, swollen glands, oral manifestations such as throat swelling/ tingling, mouth swelling/ tingling, tongue swelling/tingling, dyspnea, SOB, chest pain, abdominal pain, changes in bowel or bladder function.     ROS: As per HPI.  History reviewed. No pertinent past medical history. History reviewed. No pertinent surgical history. No Known Allergies No current facility-administered medications on file prior to encounter.    Current Outpatient Medications on File Prior to Encounter  Medication Sig Dispense Refill  . cycloSPORINE (RESTASIS) 0.05 % ophthalmic emulsion Place 2 drops into both eyes daily.     . Diphenhydramine-APAP, sleep, (MIDOL PM PO) Take 2 tablets by mouth every 4 (four) hours as needed (for cramping).     Social History   Socioeconomic History  . Marital status: Single    Spouse name: Not on file  . Number of children: Not on file  . Years of education: Not on  file  . Highest education level: Not on file  Occupational History  . Not on file  Social Needs  . Financial resource strain: Not on file  . Food insecurity:    Worry: Not on file    Inability: Not on file  . Transportation needs:    Medical: Not on file    Non-medical: Not on file  Tobacco Use  . Smoking status: Never Smoker  Substance and Sexual Activity  . Alcohol use: No  . Drug use: No  . Sexual activity: Never  Lifestyle  . Physical activity:    Days per week: Not on file    Minutes per session: Not on file  . Stress: Not on file  Relationships  . Social connections:    Talks on phone: Not on file    Gets together: Not on file    Attends religious service: Not on file    Active member of club or organization: Not on file    Attends meetings of clubs or organizations: Not on file    Relationship status: Not on file  . Intimate partner violence:    Fear of current or ex partner: Not on file    Emotionally abused: Not on file    Physically abused: Not on file    Forced sexual activity: Not on file  Other Topics Concern  . Not on file  Social History Narrative  . Not on file   No family history on file.  OBJECTIVE:  Vitals:   01/21/19 1153  BP: (!) 148/75  Pulse: 95  Resp: 18  Temp: 98.9 F (37.2 C)  SpO2: 100%     General appearance: alert; well appearing; speaking in full sentences and tolerating own secretions; morbidly obese HEENT: NCAT; no obvious facial or lip swelling, small vesicular lesion inside of left lip; Ears: EACs clear, TMs pearly gray; Eyes: PERRL.  EOM grossly intact. Nose: nares patent without rhinorrhea, Throat: oropharynx clear without vesicular lesion, tonsils non erythematous or enlarged, uvula midline  Neck: supple without LAD Lungs: difficult to assess due to body habitus Heart: difficult to assess due to body habitus.  Radial pulses 2+ symmetrical bilaterally Abdomen: protuberant; NTTP Skin: warm and dry; small papule to lateral  aspect of 2nd digit of right hand, NTTP, no obvious drainage or erythema (see picture below) no rash appreciated in interdigit space or soles of feet Psychological: alert and cooperative; normal mood and affect     LABS:   Results for orders placed or performed during the hospital encounter of 01/21/19 (from the past 24 hour(s))  CBC with Differential     Status: Abnormal   Collection Time: 01/21/19 12:57 PM  Result Value Ref Range   WBC 12.2 (H) 4.0 - 10.5 K/uL   RBC 4.30 3.87 - 5.11 MIL/uL   Hemoglobin 8.9 (L) 12.0 - 15.0 g/dL   HCT 58.8 (L) 32.5 - 49.8 %   MCV 71.6 (L) 80.0 - 100.0 fL   MCH 20.7 (L) 26.0 - 34.0 pg   MCHC 28.9 (L) 30.0 - 36.0 g/dL   RDW 26.4 (H) 15.8 - 30.9 %   Platelets 547 (H) 150 - 400 K/uL   nRBC 0.0 0.0 - 0.2 %   Neutrophils Relative % 75 %   Neutro Abs 9.1 (H) 1.7 - 7.7 K/uL   Lymphocytes Relative 21 %   Lymphs Abs 2.5 0.7 - 4.0 K/uL   Monocytes Relative 3 %   Monocytes Absolute 0.4 0.1 - 1.0 K/uL   Eosinophils Relative 1 %   Eosinophils Absolute 0.1 0.0 - 0.5 K/uL   Basophils Relative 0 %   Basophils Absolute 0.0 0.0 - 0.1 K/uL   Immature Granulocytes 0 %   Abs Immature Granulocytes 0.05 0.00 - 0.07 K/uL     ASSESSMENT & PLAN:  1. Wheezing   2. Facial swelling   3. Bilateral hand swelling   4. Rash and nonspecific skin eruption     Meds ordered this encounter  Medications  . diphenhydrAMINE (BENADRYL) 25 MG tablet    Sig: Take 1 tablet (25 mg total) by mouth every 6 (six) hours as needed.    Dispense:  30 tablet    Refill:  0    Order Specific Question:   Supervising Provider    Answer:   Eustace Moore [[id number]]   Blood work obtained.  We will follow up with you regarding abnormal results.  Benadryl prescribed.  Take as directed for swelling and itching.  DO NOT TAKE prior to driving or operating heavy machinery, this medication may make you drowsy.   Follow up with PCP if symptoms persists.  I have including Joaquin Courts FNP  information.  Please call her office first thing Monday to set up an appointment to establish care Return or go to the ED if you have any new or worsening symptoms such as fever, chills, chest pain, throat closing, throat tightness, inability to swallow liquids or saliva, abdominal pain, nausea, vomiting, changes in bowel or bladder habits, etc...   Reviewed  expectations re: course of current medical issues. Questions answered. Outlined signs and symptoms indicating need for more acute intervention. Patient verbalized understanding. After Visit Summary given.   ADDENDUM: Spoke with patient regarding abnormal results and instructed her to go to the ED for further evaluation and management immediately.  Patient aware and in agreement with this plan.       Rennis Harding, PA-C 01/21/19 1436

## 2019-01-21 NOTE — ED Triage Notes (Signed)
Pt states she was seen on 1/23 this year for flu like cold symptoms states she isn't feeling better. No fever. States in the mornings she feels wheezy. Pt states it feels like her whole body is swelling. C/o bilateral hand pain.

## 2019-01-21 NOTE — ED Triage Notes (Signed)
Pt from home; sent by urgent care for elevated WBC and low HGB; pt c/o swelling and itching to hands and lips since thursday; denies allergies; endorses trouble swallowing, says its painful; denies sob

## 2019-02-01 NOTE — Progress Notes (Signed)
Patient ID: Julie Banks, female   DOB: 15-Mar-1996, 23 y.o.   MRN: [id number]   Julie Banks, is a 23 y.o. female  CNO:[id number]  QHU:[id number]  DOB - 04/18/1996  Subjective:  Chief Complaint and HPI: Julie Banks is a 23 y.o. female here today to establish care and for a follow up visit After being seen in the ED 01/21/2019.  She was having multiple issues that she presents with today.  Frequent and heavy periods.  Not on BC in about a year, but periods were better when she was on [facility].  She would like to restart BC.  Non-smoker.  LMP is now.  Not currently SA.  Has an appt with gyn in April.    Anemic-restarted OTC iron BID after seen in ED.  Also c/o waxing and waning rash and abdomen, back and extremities from around January with multiple "cold-like" illnesses.  Fevers on and off up to 101 as recently as this week.  Also, on and off sore and scratchy throat.  Still has a cough productive of yellowish-brown mucus.    C/o hand tingling and burning on and off.  She was not aware that her blood sugar was high in the ED.  Family history-a lot of diabetes on her mom's side of the family.    From ED HPI: Pt presents to the ED today primarily for abnormal lab value of WBC 12.2 and H/H 8.9/30.8. Pt was seen at [facility] earlier today due to multiple complaints including wheezing and URI symptoms x 1 month, and facial and hand swelling and intermittent rash x 2 days.  She was previously seen about 1 month ago for same, tested for flu and strep throat which were both negative. Pt also had a CXR done at that time with no acute findings. She reports that for the past month she has been having these same symptoms, unresolved. She returned to UC today due to the bilateral hand swelling with a burning sensation and a small papule to her right index finger. Pt's physical exam was unremarkable, she was told to take benadryl, and discharged home. She had a CBC done at that time and told they would  call if results were abnormal. Once she received call about leukocytosis she was told to come to the ED for further eval.   From A/P: Pt presents for abnormal lab value of leukocytosis 12.2 after going to UC today for URI like symptoms x 1 month. H/H also decreased to 8.9/30.8; not experiencing dizziness/lightheadedness; denies blood in stool; endorses heavy menstrual periods that last > 2 weeks; has been evaluated for anemia in the past and used to be on iron supplements; currently takes a multivitamin but unsure if it contains Fe. Pt has followed up with OBGYN in the past regarding heavy menstrual flow; was put on OCPs which only decreased length mildly; discontinued taking them and has not been back to see OBGYN.   Pt complaining of bilateral swelling in hands with a burning sensation. Pt does not appear volume overloaded at this time. No change in soaps, detergents, or any hygiene products. She also has had 1 month of URI like symptoms; LCTAB; no posterior oropharyngeal edema or erythema; TMs clear; no cervical lymphadenopathy. Will order baseline labs given pt's elevated leukocytosis today as well as CXR to rule out infectious process although low suspicion for such.   CMP with elevated glucose of 199. Calcium 8.4 and albumin 3.2. CXR negative for any acute process. Awaiting  U/A results although given clinical course low suspicion for abnormal results. Pt likely experiencing symptoms from chronic issues that should be addressed with PCP.   U/A with large Hgb; pt currently on menstrual period.   Dr. Laverta Baltimore evaluated pt as well; agrees that pt is having multiple chronic issues that need to be discussed with PCP and OBGYN. Discussed lab findings and CXR with patient; she is still unsure why she is having a burning sensation in her hands; could possibly be neuropathic pain as pt has hyperglycemia on CMP. Will give pt instructions to call OBGYN Karoline Caldwell who she has seen in the past. Also gave pt  information on [facility] to establish care with a PCP - advised to follow up regarding blood work, hyperglycemia.   ED/Hospital notes reviewed and summarized above  ROS:   Constitutional:  No f/c, No night sweats, No unexplained weight loss. EENT:  No vision changes, No blurry vision, No hearing changes.  Respiratory: + cough, No SOB Cardiac: No CP, no palpitations GI:  No abd pain, No N/V/D. GU: No Urinary s/sx Musculoskeletal: + hands Neuro: some headache, no dizziness, no motor weakness.  Skin: + rash Endocrine:  No polydipsia. No polyuria.  Psych: Denies SI/HI  No problems updated.  ALLERGIES: No Known Allergies  PAST MEDICAL HISTORY: History reviewed. No pertinent past medical history.  MEDICATIONS AT HOME: Prior to Admission medications   Medication Sig Start Date End Date Taking? Authorizing Provider  azithromycin (ZITHROMAX) 250 MG tablet 2 today then 1 daily 02/02/19   Argentina Donovan, PA-C  Blood Glucose Monitoring Suppl (TRUE METRIX METER) w/Device KIT Check blood sugar fasting daily 02/02/19   Argentina Donovan, PA-C  cetirizine (ZYRTEC) 10 MG tablet Take 1 tablet (10 mg total) by mouth daily. 02/02/19   Argentina Donovan, PA-C  cycloSPORINE (RESTASIS) 0.05 % ophthalmic emulsion Place 2 drops into both eyes daily.     [provider]  diphenhydrAMINE (BENADRYL) 25 MG tablet Take 1 tablet (25 mg total) by mouth every 6 (six) hours as needed. Patient not taking: Reported on 02/02/2019 01/21/19   [place], [place], PA-C  Diphenhydramine-APAP, sleep, (MIDOL PM PO) Take 2 tablets by mouth every 4 (four) hours as needed (for cramping).    [provider]  glucose blood (TRUE METRIX BLOOD GLUCOSE TEST) test strip Use as instructed 02/02/19   Argentina Donovan, PA-C  levonorgestrel-ethinyl estradiol (AVIANE,ALESSE,LESSINA) 0.1-20 MG-MCG tablet Take 1 tablet by mouth daily. 02/02/19   Argentina Donovan, PA-C  metFORMIN (GLUCOPHAGE) 500 MG tablet Take  1 tablet (500 mg total) by mouth 2 (two) times daily with a meal. 02/02/19   , Dionne Bucy, PA-C  TRUEplus Lancets 28G MISC 1 each by Does not apply route daily. 02/02/19   Argentina Donovan, PA-C     Objective:  EXAM:   Vitals:   02/02/19 1025  BP: (!) 152/77  Pulse: 96  Resp: 16  Temp: 99.1 F (37.3 C)  TempSrc: Oral  SpO2: 99%  Weight: (!) 400 lb (181.4 kg)  Height: _0  (1.6 m)    General appearance : A&OX3. NAD. Non-toxic-appearing, morbidly obese HEENT: Atraumatic and Normocephalic.  PERRLA. EOM intact.  TM clear B. Mouth-MMM, post pharynx WNL w/o erythema, + PND. Neck: supple, no JVD. No cervical lymphadenopathy. No thyromegaly Chest/Lungs:  Breathing-non-labored, Good air entry bilaterally, breath sounds normal without rales, rhonchi, or wheezing  CVS: S1 S2 regular, no murmurs, gallops, rubs  Extremities: Bilateral Lower Ext  shows no edema, both legs are warm to touch with = pulse throughout Neurology:  CN II-XII grossly intact, Non focal.   Psych:  TP linear. J/I WNL. Normal speech. Appropriate eye contact and affect.  Skin:  Hyperpigmented small oval patches along arms and abdomen.  No definite hearld patch.  No other rash.  Appears like pityriasis rosea.    Data Review Lab Results  Component Value Date   HGBA1C 7.1 (A) 02/02/2019     Assessment & Plan   1.  Type 2 diabetes mellitus with hyperglycemia, without long-term current use of insulin ([place]) New diagnosis.  Counseled on diet at length.  Eliminate sugars and white carbs.  Focus diet around water, protein, and vegetables.   - Blood Glucose Monitoring Suppl (TRUE METRIX METER) w/Device KIT; Check blood sugar fasting daily  Dispense: 1 kit; Refill: 0 - glucose blood (TRUE METRIX BLOOD GLUCOSE TEST) test strip; Use as instructed  Dispense: 100 each; Refill: 12 - TRUEplus Lancets 28G MISC; 1 each by Does not apply route daily.  Dispense: 100 each; Refill: 1 - metFORMIN (GLUCOPHAGE) 500 MG tablet; Take 1  tablet (500 mg total) by mouth 2 (two) times daily with a meal.  Dispense: 180 tablet; Refill: 3 - HgB A1c - Glucose (CBG)  2. Menorrhagia with irregular cycle - CBC with Differential/Platelet - levonorgestrel-ethinyl estradiol (AVIANE,ALESSE,LESSINA) 0.1-20 MG-MCG tablet; Take 1 tablet by mouth daily.  Dispense: 1 Package; Refill: 3 -keep gyn f/up  3. Cough/uri s/sx for 2 months-will cover for atypicals - Sedimentation Rate - CBC with Differential/Platelet - azithromycin (ZITHROMAX) 250 MG tablet; 2 today then 1 daily  Dispense: 6 tablet; Refill: 0  4. Sore throat -possibly due to PR(#5) - Mononucleosis screen - CBC with Differential/Platelet  5. Pityriasis rosea - cetirizine (ZYRTEC) 10 MG tablet; Take 1 tablet (10 mg total) by mouth daily.  Dispense: 30 tablet; Refill: 11  6. Anemia, unspecified type Continue iron bid - CBC with Differential/Platelet  7. Fever, unspecified fever cause Tylenol or advil for fevers - Mononucleosis screen - Sedimentation Rate  9. Encounter for examination following treatment at hospital Improving but multiple issues  10. Class 3 severe obesity with serious comorbidity and body mass index (BMI) greater than or equal to 70 in adult, unspecified obesity type (HCC) Spent >35 mins face to face reviewing ll diagnoses, encouraging healthy, low sugar/carb diet, weight loss being imperative for overall health.  Will also need to follow BP closely.     Patient have been counseled extensively about nutrition and exercise  Return in about 3 weeks (around 02/23/2019) for assign PCP; follow-up multiple issues.  The patient was given clear instructions to go to ER or return to medical center if symptoms don't improve, worsen or new problems develop. The patient verbalized understanding. The patient was told to call to get lab results if they haven't heard anything in the next week.     Freeman Caldron, PA-C [facility] [place], [place]   02/02/2019, 12:05 PM

## 2019-02-02 ENCOUNTER — Ambulatory Visit: Payer: Self-pay | Attending: Family Medicine | Admitting: Physician Assistant

## 2019-02-02 ENCOUNTER — Other Ambulatory Visit: Payer: Self-pay

## 2019-02-02 VITALS — BP 152/77 | HR 96 | Temp 99.1°F | Resp 16 | Ht 63.0 in | Wt >= 6400 oz

## 2019-02-02 DIAGNOSIS — Z833 Family history of diabetes mellitus: Secondary | ICD-10-CM | POA: Insufficient documentation

## 2019-02-02 DIAGNOSIS — J029 Acute pharyngitis, unspecified: Secondary | ICD-10-CM | POA: Insufficient documentation

## 2019-02-02 DIAGNOSIS — Z79899 Other long term (current) drug therapy: Secondary | ICD-10-CM | POA: Insufficient documentation

## 2019-02-02 DIAGNOSIS — E1165 Type 2 diabetes mellitus with hyperglycemia: Secondary | ICD-10-CM | POA: Insufficient documentation

## 2019-02-02 DIAGNOSIS — Z7984 Long term (current) use of oral hypoglycemic drugs: Secondary | ICD-10-CM | POA: Insufficient documentation

## 2019-02-02 DIAGNOSIS — Z6841 Body Mass Index (BMI) 40.0 and over, adult: Secondary | ICD-10-CM | POA: Insufficient documentation

## 2019-02-02 DIAGNOSIS — R059 Cough, unspecified: Secondary | ICD-10-CM

## 2019-02-02 DIAGNOSIS — R509 Fever, unspecified: Secondary | ICD-10-CM | POA: Insufficient documentation

## 2019-02-02 DIAGNOSIS — Z793 Long term (current) use of hormonal contraceptives: Secondary | ICD-10-CM | POA: Insufficient documentation

## 2019-02-02 DIAGNOSIS — Z09 Encounter for follow-up examination after completed treatment for conditions other than malignant neoplasm: Secondary | ICD-10-CM

## 2019-02-02 DIAGNOSIS — L42 Pityriasis rosea: Secondary | ICD-10-CM | POA: Insufficient documentation

## 2019-02-02 DIAGNOSIS — N921 Excessive and frequent menstruation with irregular cycle: Secondary | ICD-10-CM | POA: Insufficient documentation

## 2019-02-02 DIAGNOSIS — D649 Anemia, unspecified: Secondary | ICD-10-CM | POA: Insufficient documentation

## 2019-02-02 DIAGNOSIS — R05 Cough: Secondary | ICD-10-CM | POA: Insufficient documentation

## 2019-02-02 DIAGNOSIS — Z713 Dietary counseling and surveillance: Secondary | ICD-10-CM

## 2019-02-02 DIAGNOSIS — R739 Hyperglycemia, unspecified: Secondary | ICD-10-CM

## 2019-02-02 LAB — POCT GLYCOSYLATED HEMOGLOBIN (HGB A1C): Hemoglobin A1C: 7.1 % — AB (ref 4.0–5.6)

## 2019-02-02 LAB — GLUCOSE, POCT (MANUAL RESULT ENTRY): POC Glucose: 227 mg/dl — AB (ref 70–99)

## 2019-02-02 MED ORDER — CETIRIZINE HCL 10 MG PO TABS: 10.0000 mg | ORAL_TABLET | Freq: Every day | ORAL | 11 refills | Status: DC

## 2019-02-02 MED ORDER — METFORMIN HCL 500 MG PO TABS: 500.0000 mg | ORAL_TABLET | Freq: Two times a day (BID) | ORAL | 3 refills | Status: DC

## 2019-02-02 MED ORDER — LEVONORGESTREL-ETHINYL ESTRAD 0.1-20 MG-MCG PO TABS: 1.0000 | ORAL_TABLET | Freq: Every day | ORAL | 3 refills | Status: DC

## 2019-02-02 MED ORDER — TRUE METRIX METER W/DEVICE KIT: PACK | 0 refills | Status: DC

## 2019-02-02 MED ORDER — AZITHROMYCIN 250 MG PO TABS: ORAL_TABLET | ORAL | 0 refills | Status: DC

## 2019-02-02 MED ORDER — TRUEPLUS LANCETS 28G MISC: 1.0000 | Freq: Every day | 1 refills | Status: DC

## 2019-02-02 MED ORDER — GLUCOSE BLOOD VI STRP: ORAL_STRIP | 12 refills | Status: DC

## 2019-02-02 MED FILL — LEVONOR-ETH ESTRAD 0.1-0.02: 0.1-20 | 28 days supply | Qty: 28 | Fill #0

## 2019-02-02 MED FILL — TRUE METRIX TEST STRIP: 25 days supply | Qty: 100 | Fill #0

## 2019-02-02 MED FILL — !TRUE METRIX BLOOD GLUCOSE: 365 days supply | Qty: 1 | Fill #0

## 2019-02-02 MED FILL — AZITHROMYCIN 250 MG TABLET: 250 | 5 days supply | Qty: 6 | Fill #0

## 2019-02-02 MED FILL — TRUEplus LANCETS 28G MISC: 25 days supply | Qty: 100 | Fill #0

## 2019-02-02 MED FILL — ?METFORMIN HCL 500MG TABL: 500 | 30 days supply | Qty: 60 | Fill #0

## 2019-02-02 NOTE — Patient Instructions (Signed)
Pityriasis Rosea Pityriasis rosea is a rash that usually appears on the chest, abdomen, and back. It may also appear on the upper arms and upper legs. It usually begins as a single patch, and then more patches start to develop. The rash may cause mild itching, but it normally does not cause other problems. It usually goes away without treatment. However, it may take weeks or months for the rash to go away completely. What are the causes? The cause of this condition is not known. The condition does not spread from person to person (is not contagious). What increases the risk? This condition is more likely to develop in:  Persons aged 10-35 years.  Pregnant women. It is more common in the spring and fall seasons. What are the signs or symptoms? The main symptom of this condition is a rash.  The rash usually begins with a single oval patch that is larger than the ones that follow. This is called a herald patch. It generally appears a week or more before the rest of the rash appears.  When more patches start to develop, they spread quickly on the chest, abdomen, back, arms, and legs. These patches are smaller than the first one.  The patches that make up the rash are usually oval-shaped and pink or red in color. They are usually flat but may sometimes be raised so that they can be felt with a finger. They may also be finely crinkled and have a scaly ring around the edge. Some people may have mild itching and nonspecific symptoms, such as:  Nausea.  Loss of appetite.  Difficulty concentrating.  Headache.  Irritability.  Sore throat.  Mild fever. How is this diagnosed? This condition may be diagnosed based on:  Your medical history and a physical exam.  Tests to rule out other causes. This may include blood tests or a test in which a small sample of skin is removed from the rash (biopsy) and checked in a lab. How is this treated?     Treatment is not usually needed for this  condition. The rash will often go away on its own in 4-8 weeks. In some cases, a health care provider may recommend or prescribe medicine to reduce itching. Follow these instructions at home:  Take or apply over-the-counter and prescription medicines only as told by your health care provider.  Avoid scratching the affected areas of skin.  Do not take hot baths or use a sauna. Use only warm water when bathing or showering. Heat can increase itching. Adding cornstarch to your bath may help to relieve the itching.  Avoid exposure to the sun and other sources of UV light, such as tanning beds, as told by your health care provider. UV light may help the rash go away but may cause unwanted changes in skin color.  Keep all follow-up visits as told by your health care provider. This is important. Contact a health care provider if:  Your rash does not go away in 8 weeks.  Your rash gets much worse.  You have a fever.  You have swelling or pain in the rash area.  You have fluid, blood, or pus coming from the rash area. Summary  Pityriasis rosea is a rash that usually appears on the trunk of the body. It can also appear on the upper arms and upper legs.  The rash usually begins with a single oval patch (herald patch) that appears a week or more before the rest of the rash appears.   the rash appears. The herald patch is larger than the ones that follow.  The rash may cause mild itching, but it usually does not cause other problems. It usually goes away without treatment in 4-8 weeks.  In some cases, a health care provider may recommend or prescribe medicine to reduce itching. This information is not intended to replace advice given to you by your health care provider. Make sure you discuss any questions you have with your health care provider. Document Released: 12/16/2001 Document Revised: 11/08/2017 Document Reviewed: 11/08/2017 Elsevier Interactive Patient Education  2019 Elsevier Inc. Diabetes Mellitus  and Nutrition, Adult When you have diabetes (diabetes mellitus), it is very important to have healthy eating habits because your blood sugar (glucose) levels are greatly affected by what you eat and drink. Eating healthy foods in the appropriate amounts, at about the same times every day, can help you:  Control your blood glucose.  Lower your risk of heart disease.  Improve your blood pressure.  Reach or maintain a healthy weight. Every person with diabetes is different, and each person has different needs for a meal plan. Your health care provider may recommend that you work with a diet and nutrition specialist (dietitian) to make a meal plan that is best for you. Your meal plan may vary depending on factors such as:  The calories you need.  The medicines you take.  Your weight.  Your blood glucose, blood pressure, and cholesterol levels.  Your activity level.  Other health conditions you have, such as heart or kidney disease. How do carbohydrates affect me? Carbohydrates, also called carbs, affect your blood glucose level more than any other type of food. Eating carbs naturally raises the amount of glucose in your blood. Carb counting is a method for keeping track of how many carbs you eat. Counting carbs is important to keep your blood glucose at a healthy level, especially if you use insulin or take certain oral diabetes medicines. It is important to know how many carbs you can safely have in each meal. This is different for every person. Your dietitian can help you calculate how many carbs you should have at each meal and for each snack. Foods that contain carbs include:  Bread, cereal, rice, pasta, and crackers.  Potatoes and corn.  Peas, beans, and lentils.  Milk and yogurt.  Fruit and juice.  Desserts, such as cakes, cookies, ice cream, and candy. How does alcohol affect me? Alcohol can cause a sudden decrease in blood glucose (hypoglycemia), especially if you use  insulin or take certain oral diabetes medicines. Hypoglycemia can be a life-threatening condition. Symptoms of hypoglycemia (sleepiness, dizziness, and confusion) are similar to symptoms of having too much alcohol. If your health care provider says that alcohol is safe for you, follow these guidelines:  Limit alcohol intake to no more than 1 drink per day for nonpregnant women and 2 drinks per day for men. One drink equals 12 oz of beer, 5 oz of wine, or 1 oz of hard liquor.  Do not drink on an empty stomach.  Keep yourself hydrated with water, diet soda, or unsweetened iced tea.  Keep in mind that regular soda, juice, and other mixers may contain a lot of sugar and must be counted as carbs. What are tips for following this plan?  Reading food labels  Start by checking the serving size on the "Nutrition Facts" label of packaged foods and drinks. The amount of calories, carbs, fats, and other nutrients listed on  the label is based on one serving of the item. Many items contain more than one serving per package.  Check the total grams (g) of carbs in one serving. You can calculate the number of servings of carbs in one serving by dividing the total carbs by 15. For example, if a food has 30 g of total carbs, it would be equal to 2 servings of carbs.  Check the number of grams (g) of saturated and trans fats in one serving. Choose foods that have low or no amount of these fats.  Check the number of milligrams (mg) of salt (sodium) in one serving. Most people should limit total sodium intake to less than 2,300 mg per day.  Always check the nutrition information of foods labeled as "low-fat" or "nonfat". These foods may be higher in added sugar or refined carbs and should be avoided.  Talk to your dietitian to identify your daily goals for nutrients listed on the label. Shopping  Avoid buying canned, premade, or processed foods. These foods tend to be high in fat, sodium, and added sugar.   Shop around the outside edge of the grocery store. This includes fresh fruits and vegetables, bulk grains, fresh meats, and fresh dairy. Cooking  Use low-heat cooking methods, such as baking, instead of high-heat cooking methods like deep frying.  Cook using healthy oils, such as olive, canola, or sunflower oil.  Avoid cooking with butter, cream, or high-fat meats. Meal planning  Eat meals and snacks regularly, preferably at the same times every day. Avoid going long periods of time without eating.  Eat foods high in fiber, such as fresh fruits, vegetables, beans, and whole grains. Talk to your dietitian about how many servings of carbs you can eat at each meal.  Eat 4-6 ounces (oz) of lean protein each day, such as lean meat, chicken, fish, eggs, or tofu. One oz of lean protein is equal to: ? 1 oz of meat, chicken, or fish. ? 1 egg. ?  cup of tofu.  Eat some foods each day that contain healthy fats, such as avocado, nuts, seeds, and fish. Lifestyle  Check your blood glucose regularly.  Exercise regularly as told by your health care provider. This may include: ? 150 minutes of moderate-intensity or vigorous-intensity exercise each week. This could be brisk walking, biking, or water aerobics. ? Stretching and doing strength exercises, such as yoga or weightlifting, at least 2 times a week.  Take medicines as told by your health care provider.  Do not use any products that contain nicotine or tobacco, such as cigarettes and e-cigarettes. If you need help quitting, ask your health care provider.  Work with a Veterinary surgeon or diabetes educator to identify strategies to manage stress and any emotional and social challenges. Questions to ask a health care provider  Do I need to meet with a diabetes educator?  Do I need to meet with a dietitian?  What number can I call if I have questions?  When are the best times to check my blood glucose? Where to find more information:  American  Diabetes Association: diabetes.org  Academy of Nutrition and Dietetics: www.eatright.[employer] of Diabetes and Digestive and Kidney Diseases (NIH): CarFlippers.tn Summary  A healthy meal plan will help you control your blood glucose and maintain a healthy lifestyle.  Working with a diet and nutrition specialist (dietitian) can help you make a meal plan that is best for you.  Keep in mind that carbohydrates (carbs) and alcohol  have immediate effects on your blood glucose levels. It is important to count carbs and to use alcohol carefully. This information is not intended to replace advice given to you by your health care provider. Make sure you discuss any questions you have with your health care provider. Document Released: 08/06/2005 Document Revised: 06/09/2017 Document Reviewed: 12/14/2016 Elsevier Interactive Patient Education  2019 [employer].

## 2019-02-03 LAB — CBC WITH DIFFERENTIAL/PLATELET
BASOS ABS: 0 10*3/uL (ref 0.0–0.2)
Basos: 0 %
EOS (ABSOLUTE): 0.1 10*3/uL (ref 0.0–0.4)
Eos: 1 %
Hematocrit: 27 % — ABNORMAL LOW (ref 34.0–46.6)
Hemoglobin: 7.8 g/dL — ABNORMAL LOW (ref 11.1–15.9)
Immature Grans (Abs): 0 10*3/uL (ref 0.0–0.1)
Immature Granulocytes: 0 %
Lymphocytes Absolute: 2 10*3/uL (ref 0.7–3.1)
Lymphs: 22 %
MCH: 20.1 pg — ABNORMAL LOW (ref 26.6–33.0)
MCHC: 28.9 g/dL — AB (ref 31.5–35.7)
MCV: 70 fL — ABNORMAL LOW (ref 79–97)
MONOCYTES: 4 %
MONOS ABS: 0.4 10*3/uL (ref 0.1–0.9)
Neutrophils Absolute: 6.4 10*3/uL (ref 1.4–7.0)
Neutrophils: 73 %
Platelets: 527 10*3/uL — ABNORMAL HIGH (ref 150–450)
RBC: 3.88 x10E6/uL (ref 3.77–5.28)
RDW: 16.9 % — AB (ref 11.7–15.4)
WBC: 9 10*3/uL (ref 3.4–10.8)

## 2019-02-03 LAB — MONONUCLEOSIS SCREEN: Mono Screen: NEGATIVE

## 2019-02-03 LAB — SEDIMENTATION RATE: SED RATE: 67 mm/h — AB (ref 0–32)

## 2019-02-05 ENCOUNTER — Other Ambulatory Visit: Payer: Self-pay | Admitting: Physician Assistant

## 2019-02-05 DIAGNOSIS — D649 Anemia, unspecified: Secondary | ICD-10-CM

## 2019-02-05 MED ORDER — FERROUS FUMARATE 325 (106 FE) MG PO TABS: 1.0000 | ORAL_TABLET | Freq: Two times a day (BID) | ORAL | 1 refills | Status: DC

## 2019-02-06 ENCOUNTER — Telehealth: Payer: Self-pay

## 2019-02-06 NOTE — Telephone Encounter (Signed)
[name] spoke to patient to inform on results and Rx.  Pt. Verified DOB.  Pt. Understood.  Pt. Stated the Metformin is making her stomach feel upset where she would vomit.  Pt. Would like to know if there is any other way for her to take it or any other options.

## 2019-02-06 NOTE — Telephone Encounter (Signed)
-----   Message from Anders Simmonds, [place] sent at 02/05/2019 11:59 AM EDT ----- Please call patient.  Her hemoglobin is very low.  She needs to take iron 3 times daily for a week then twice daily. If she starts to feel weak/dizzy worse, she should go to the ED and may need a blood transfusion if her hemoglobin gets lower. Her inflammatory rate is elevated.  We will recheck bloodwork at her follow-up appt and may need to refer her for this.  Follow up as planned. Thanks, Georgian Co, PA-C

## 2019-02-08 NOTE — Telephone Encounter (Signed)
She can take 1/2 tablet twice a day for a week then increase to a whole tablet twice a day.  Take with food.  These things should reduce her side effects.  Also, the side effects usu go away after a couple of weeks as her body adjusts to it.

## 2019-02-09 NOTE — Telephone Encounter (Signed)
[name] spoke to patient to inform on medication advising.  Pt. Understood.

## 2019-02-27 ENCOUNTER — Other Ambulatory Visit: Payer: Self-pay

## 2019-02-27 ENCOUNTER — Ambulatory Visit: Payer: Medicaid Other | Admitting: Obstetrics & Gynecology

## 2019-02-27 NOTE — Progress Notes (Signed)
dnka

## 2019-02-27 NOTE — Progress Notes (Signed)
Attempted to call pt twice for telehealth visit with no success.

## 2019-03-02 MED FILL — metFORMIN HCL 500 MG TABS: 500 | 30 days supply | Qty: 60 | Fill #1

## 2019-03-02 MED FILL — LEVONOR-ETH ESTRAD 0.1-0.02: 0.1-20 | 28 days supply | Qty: 28 | Fill #1

## 2019-03-15 ENCOUNTER — Ambulatory Visit: Payer: Medicaid Other | Attending: Nurse Practitioner | Admitting: Nurse Practitioner

## 2019-03-15 ENCOUNTER — Other Ambulatory Visit: Payer: Self-pay

## 2019-03-15 ENCOUNTER — Encounter: Payer: Self-pay | Admitting: Nurse Practitioner

## 2019-03-15 DIAGNOSIS — G4709 Other insomnia: Secondary | ICD-10-CM

## 2019-03-15 DIAGNOSIS — IMO0002 Reserved for concepts with insufficient information to code with codable children: Secondary | ICD-10-CM

## 2019-03-15 DIAGNOSIS — F329 Major depressive disorder, single episode, unspecified: Secondary | ICD-10-CM

## 2019-03-15 DIAGNOSIS — E1165 Type 2 diabetes mellitus with hyperglycemia: Secondary | ICD-10-CM

## 2019-03-15 DIAGNOSIS — F419 Anxiety disorder, unspecified: Secondary | ICD-10-CM

## 2019-03-15 DIAGNOSIS — E118 Type 2 diabetes mellitus with unspecified complications: Principal | ICD-10-CM

## 2019-03-15 DIAGNOSIS — N921 Excessive and frequent menstruation with irregular cycle: Secondary | ICD-10-CM

## 2019-03-15 MED ORDER — TRAZODONE HCL 50 MG PO TABS: 50.0000 mg | ORAL_TABLET | Freq: Every day | ORAL | 3 refills | Status: AC

## 2019-03-15 MED ORDER — HYDROXYZINE HCL 25 MG PO TABS: 25.0000 mg | ORAL_TABLET | Freq: Three times a day (TID) | ORAL | 1 refills | Status: AC | PRN

## 2019-03-15 MED FILL — traZODone HCL 50 MG TABS: 50 | 30 days supply | Qty: 30 | Fill #0

## 2019-03-15 MED FILL — hydrOXYzine HCL 25 MG TABS: 25 | 20 days supply | Qty: 60 | Fill #0

## 2019-03-15 NOTE — Progress Notes (Signed)
Virtual Visit via Telephone Note  I connected with Julie Banks on 03/15/19  at  10:38  AM EDT  EDT by telephone and verified that I am speaking with the correct person using two identifiers.   Consent I discussed the limitations, risks, security and privacy concerns of performing an evaluation and management service by telephone and the availability of in person appointments. I also discussed with the patient that there may be a patient responsible charge related to this service. The patient expressed understanding and agreed to proceed.   Location of Patient: Private Residence    Location of Provider: [facility]    Persons participating in Telemedicine visit: Bertram Denver FNP-BC YY [place] [name] Julie Banks    History of Present Illness: Telemedicine to establish care She has a history of DM Type 2, menorrhagia with irregular cycles, anemia and morbid obesity.     Anxiety and Depression with Insomnia Having difficulty sleeping over the past few weeks since the start of COVID-19. Goes to sleep around 3-4 am and then wakes up around 6 or 8 am. She does not drink caffeine late at night but does watch tv late.   In regard to anxiety sometimes feels like she is going to have a panic attack. Symptoms include: feelings of losing control, heart racing, racing thoughts and shortness of breath.   Depression screen [facility] 2/9 03/15/2019  Decreased Interest 2  Down, Depressed, Hopeless 1  PHQ - 2 Score 3  Altered sleeping 3  Tired, decreased energy 3  Change in appetite 3  Feeling bad or failure about yourself  2  Trouble concentrating 0  Moving slowly or fidgety/restless 0  Suicidal thoughts 0  PHQ-9 Score 14  Difficult doing work/chores Somewhat difficult   Menorrhagia with metrorrhagia She was referred for a telemedicine appointment with Gynecology recently however she missed her appointment. She was placed on OCPs last month however continues to  experience heavy bleeding with irregular cycles. I have instructed her that we may need wait another menstrual cycle before switching pills as she just started them and may need another month for cycles to become regular. I have also instructed her to contact the GYN office to reschedule her telemedicine appointment.  She is currently taking ferrous fumurate 325mg  1 tab BID.     DM TYPE 2 Taking metformin 500 mg BID. Average BG readings at home: 110-140s. She endorses medication compliance. Denis any hypo or hyperglycemic symptoms. We discussed dietary management in regard to carbohydrate intake as well as complex/simple sugars. She is overdue for eye exam.  Lab Results  Component Value Date   HGBA1C 7.1 (A) 02/02/2019   Lab Results  Component Value Date   CREATININE 0.70 01/21/2019    Observations/Objective: Aware, alert and oriented x 3    Assessment and Plan: Diagnoses and all orders for this visit:  Diabetes mellitus type 2, uncontrolled, with complications (HCC) -     Microalbumin / creatinine urine ratio; Future -     Ambulatory referral to Ophthalmology -     Lipid panel; Future -     atorvastatin (LIPITOR) 20 MG tablet; Take 1 tablet (20 mg total) by mouth daily. Continue blood sugar control as discussed in office today, low carbohydrate diet, and regular physical exercise as tolerated, 150 minutes per week (30 min each day, 5 days per week, or 50 min 3 days per week). Keep blood sugar logs with fasting goal of 90-130 mg/dl, post prandial (after  you eat) less than 180.  For Hypoglycemia: BS <60 and Hyperglycemia BS >400; contact the clinic ASAP. Annual eye exams and foot exams are recommended.   Anxiety and depression -     TSH -     hydrOXYzine (ATARAX/VISTARIL) 25 MG tablet; Take 1 tablet (25 mg total) by mouth 3 (three) times daily as needed for up to 30 days. -     traZODone (DESYREL) 50 MG tablet; Take 1 tablet (50 mg total) by mouth at bedtime for 30 days.  Other  insomnia -     traZODone (DESYREL) 50 MG tablet; Take 1 tablet (50 mg total) by mouth at bedtime for 30 days.  Menorrhagia with irregular cycle -     CBC -     Iron, TIBC and Ferritin Panel Follow up with GYN   Follow Up Instructions Return in about 3 weeks (around 04/05/2019) for anxiety, depression, insomnia, menorrhagia.     I discussed the assessment and treatment plan with the patient. The patient was provided an opportunity to ask questions and all were answered. The patient agreed with the plan and demonstrated an understanding of the instructions.   The patient was advised to call back or seek an in-person evaluation if the symptoms worsen or if the condition fails to improve as anticipated.  I provided 18 minutes of non-face-to-face time during this encounter including median intraservice time, reviewing previous notes, labs, imaging, medications and explaining diagnosis and management.  Claiborne RiggZelda W Fleming, FNP-BC

## 2019-03-16 ENCOUNTER — Encounter: Payer: Self-pay | Admitting: Nurse Practitioner

## 2019-03-16 DIAGNOSIS — F419 Anxiety disorder, unspecified: Secondary | ICD-10-CM

## 2019-03-16 DIAGNOSIS — IMO0002 Reserved for concepts with insufficient information to code with codable children: Secondary | ICD-10-CM | POA: Insufficient documentation

## 2019-03-16 DIAGNOSIS — E1165 Type 2 diabetes mellitus with hyperglycemia: Secondary | ICD-10-CM | POA: Insufficient documentation

## 2019-03-16 DIAGNOSIS — E118 Type 2 diabetes mellitus with unspecified complications: Principal | ICD-10-CM

## 2019-03-16 DIAGNOSIS — F329 Major depressive disorder, single episode, unspecified: Secondary | ICD-10-CM | POA: Insufficient documentation

## 2019-03-16 DIAGNOSIS — G4709 Other insomnia: Secondary | ICD-10-CM | POA: Insufficient documentation

## 2019-03-16 DIAGNOSIS — N921 Excessive and frequent menstruation with irregular cycle: Secondary | ICD-10-CM | POA: Insufficient documentation

## 2019-03-16 MED ORDER — ATORVASTATIN CALCIUM 20 MG PO TABS: 20.0000 mg | ORAL_TABLET | Freq: Every day | ORAL | 3 refills | Status: DC

## 2019-03-17 MED FILL — ATORVASTATIN 20 MG TABLET: 20 | 30 days supply | Qty: 30 | Fill #0

## 2019-04-05 ENCOUNTER — Encounter: Payer: Self-pay | Admitting: Nurse Practitioner

## 2019-04-05 ENCOUNTER — Other Ambulatory Visit: Payer: Self-pay

## 2019-04-05 ENCOUNTER — Ambulatory Visit: Payer: Self-pay | Attending: Nurse Practitioner | Admitting: Nurse Practitioner

## 2019-04-05 DIAGNOSIS — N921 Excessive and frequent menstruation with irregular cycle: Secondary | ICD-10-CM

## 2019-04-05 DIAGNOSIS — G44229 Chronic tension-type headache, not intractable: Secondary | ICD-10-CM

## 2019-04-05 DIAGNOSIS — F419 Anxiety disorder, unspecified: Secondary | ICD-10-CM

## 2019-04-05 DIAGNOSIS — F329 Major depressive disorder, single episode, unspecified: Secondary | ICD-10-CM

## 2019-04-05 DIAGNOSIS — F5101 Primary insomnia: Secondary | ICD-10-CM

## 2019-04-05 MED ORDER — AMITRIPTYLINE HCL 25 MG PO TABS: 25.0000 mg | ORAL_TABLET | Freq: Every evening | ORAL | 0 refills | Status: DC | PRN

## 2019-04-05 MED ORDER — LEVONORGESTREL-ETHINYL ESTRAD 0.1-20 MG-MCG PO TABS: 1.0000 | ORAL_TABLET | Freq: Every day | ORAL | 3 refills | Status: DC

## 2019-04-05 MED FILL — AMITRIPTYLINE HCL 25 MG TAB: 25 | 30 days supply | Qty: 30 | Fill #0

## 2019-04-05 MED FILL — LEVONOR-ETH ESTRAD 0.1-0.02: 0.1-20 | 84 days supply | Qty: 84 | Fill #0

## 2019-04-05 NOTE — Progress Notes (Signed)
Virtual Visit via Telephone Note Due to national recommendations of social distancing due to COVID 19, telehealth visit is felt to be most appropriate for this patient at this time.  I discussed the limitations, risks, security and privacy concerns of performing an evaluation and management service by telephone and the availability of in person appointments. I also discussed with the patient that there may be a patient responsible charge related to this service. The patient expressed understanding and agreed to proceed.    I connected with Julie Banks on 04/05/19  at   9:30 AM EDT  EDT by telephone and verified that I am speaking with the correct person using two identifiers.   Consent I discussed the limitations, risks, security and privacy concerns of performing an evaluation and management service by telephone and the availability of in person appointments. I also discussed with the patient that there may be a patient responsible charge related to this service. The patient expressed understanding and agreed to proceed.   Location of Patient: Private Residence    [facility] of Provider[facility]    Persons participating in Telemedicine visit: Bertram Denver FNP-BC YY [place] [name] Julie Banks    History of Present Illness: Telemedicine visit for: Anxiety, Depression and Insomnia. She also has complaints of chronic headaches.   Anxiety and Depression  PHQ9 score and GAD score have decreased however she still endorses increased mood irritability and being easily angered. and gets irritated a lot. She is taking hydroxyzine 25 mg sparingly. She also has been trying other calming techniques such as walking, distraction, imagery and removing herself from certain environments. She declines starting any SSRI today.  Depression screen [facility] 2/9 04/05/2019 03/15/2019  Decreased Interest 0 2  Down, Depressed, Hopeless 1 1  PHQ - 2 Score 1 3  Altered sleeping 2 3   Tired, decreased energy 0 3  Change in appetite 0 3  Feeling bad or failure about yourself  0 2  Trouble concentrating 0 0  Moving slowly or fidgety/restless 0 0  Suicidal thoughts 0 0  PHQ-9 Score 3 14  Difficult doing work/chores - Somewhat difficult   GAD 7 : Generalized Anxiety Score 04/05/2019  Nervous, Anxious, on Edge 1  Control/stop worrying 1  Worry too much - different things 0  Trouble relaxing 0  Restless 1  Easily annoyed or irritable 1  Afraid - awful might happen 0  Total GAD 7 Score 4    Insomnia Taking trazodone 50 mg as needed. Helps improved sleep pattern when she does take it.   Chronic Headaches Migraines occurring daily. Excedrin Migraine and Tylenol OTC makes headaches "somewhat" bearable. Other symptoms include: Right eye twitching prior to the onset of headaches, nausea and photophobia. Headaches are chronic in nature with onset over 1 year ago.   Past Medical History:  Diagnosis Date  . Anemia   . Diabetes mellitus without complication (HCC)     History reviewed. No pertinent surgical history.  Family History  Problem Relation Age of Onset  . Diabetes Mother   . Diabetes Sister   . Diabetes Maternal Uncle     Social History   Socioeconomic History  . Marital status: Single    Spouse name: Not on file  . Number of children: Not on file  . Years of education: Not on file  . Highest education level: Not on file  Occupational History  . Not on file  Social Needs  . Financial resource strain:  Not on file  . Food insecurity:    Worry: Not on file    Inability: Not on file  . Transportation needs:    Medical: Not on file    Non-medical: Not on file  Tobacco Use  . Smoking status: Never Smoker  . Smokeless tobacco: Never Used  Substance and Sexual Activity  . Alcohol use: No  . Drug use: No  . Sexual activity: Yes    Birth control/protection: Pill  Lifestyle  . Physical activity:    Days per week: Not on file    Minutes per  session: Not on file  . Stress: Not on file  Relationships  . Social connections:    Talks on phone: Not on file    Gets together: Not on file    Attends religious service: Not on file    Active member of club or organization: Not on file    Attends meetings of clubs or organizations: Not on file    Relationship status: Not on file  Other Topics Concern  . Not on file  Social History Narrative  . Not on file     Observations/Objective: Awake, alert and oriented x 3   ROS  Assessment and Plan: Verlon AuLeslie was seen today for follow-up.  Diagnoses and all orders for this visit:  Anxiety and depression Continue hydroxyzine as prescribed.   Primary insomnia Continue trazodone as prescribed.   Menorrhagia with irregular cycle -     levonorgestrel-ethinyl estradiol (ALESSE) 0.1-20 MG-MCG tablet; Take 1 tablet by mouth daily.  Chronic tension-type headache, not intractable -     amitriptyline (ELAVIL) 25 MG tablet; Take 1 tablet (25 mg total) by mouth at bedtime as needed for sleep.     Follow Up Instructions Return in about 3 months (around 07/06/2019).     I discussed the assessment and treatment plan with the patient. The patient was provided an opportunity to ask questions and all were answered. The patient agreed with the plan and demonstrated an understanding of the instructions.   The patient was advised to call back or seek an in-person evaluation if the symptoms worsen or if the condition fails to improve as anticipated.  I provided 16 minutes of non-face-to-face time during this encounter including median intraservice time, reviewing previous notes, labs, imaging, medications and explaining diagnosis and management.  Claiborne RiggZelda W , FNP-BC

## 2019-04-12 IMAGING — CR DG CHEST 2V
2 series · 2 of 2 positions shown · non-contrast
Comparison: Chest x-ray 12/15/2018

CLINICAL DATA: Wheezing and body swelling.

EXAM:
CHEST - 2 VIEW

[chest pa]
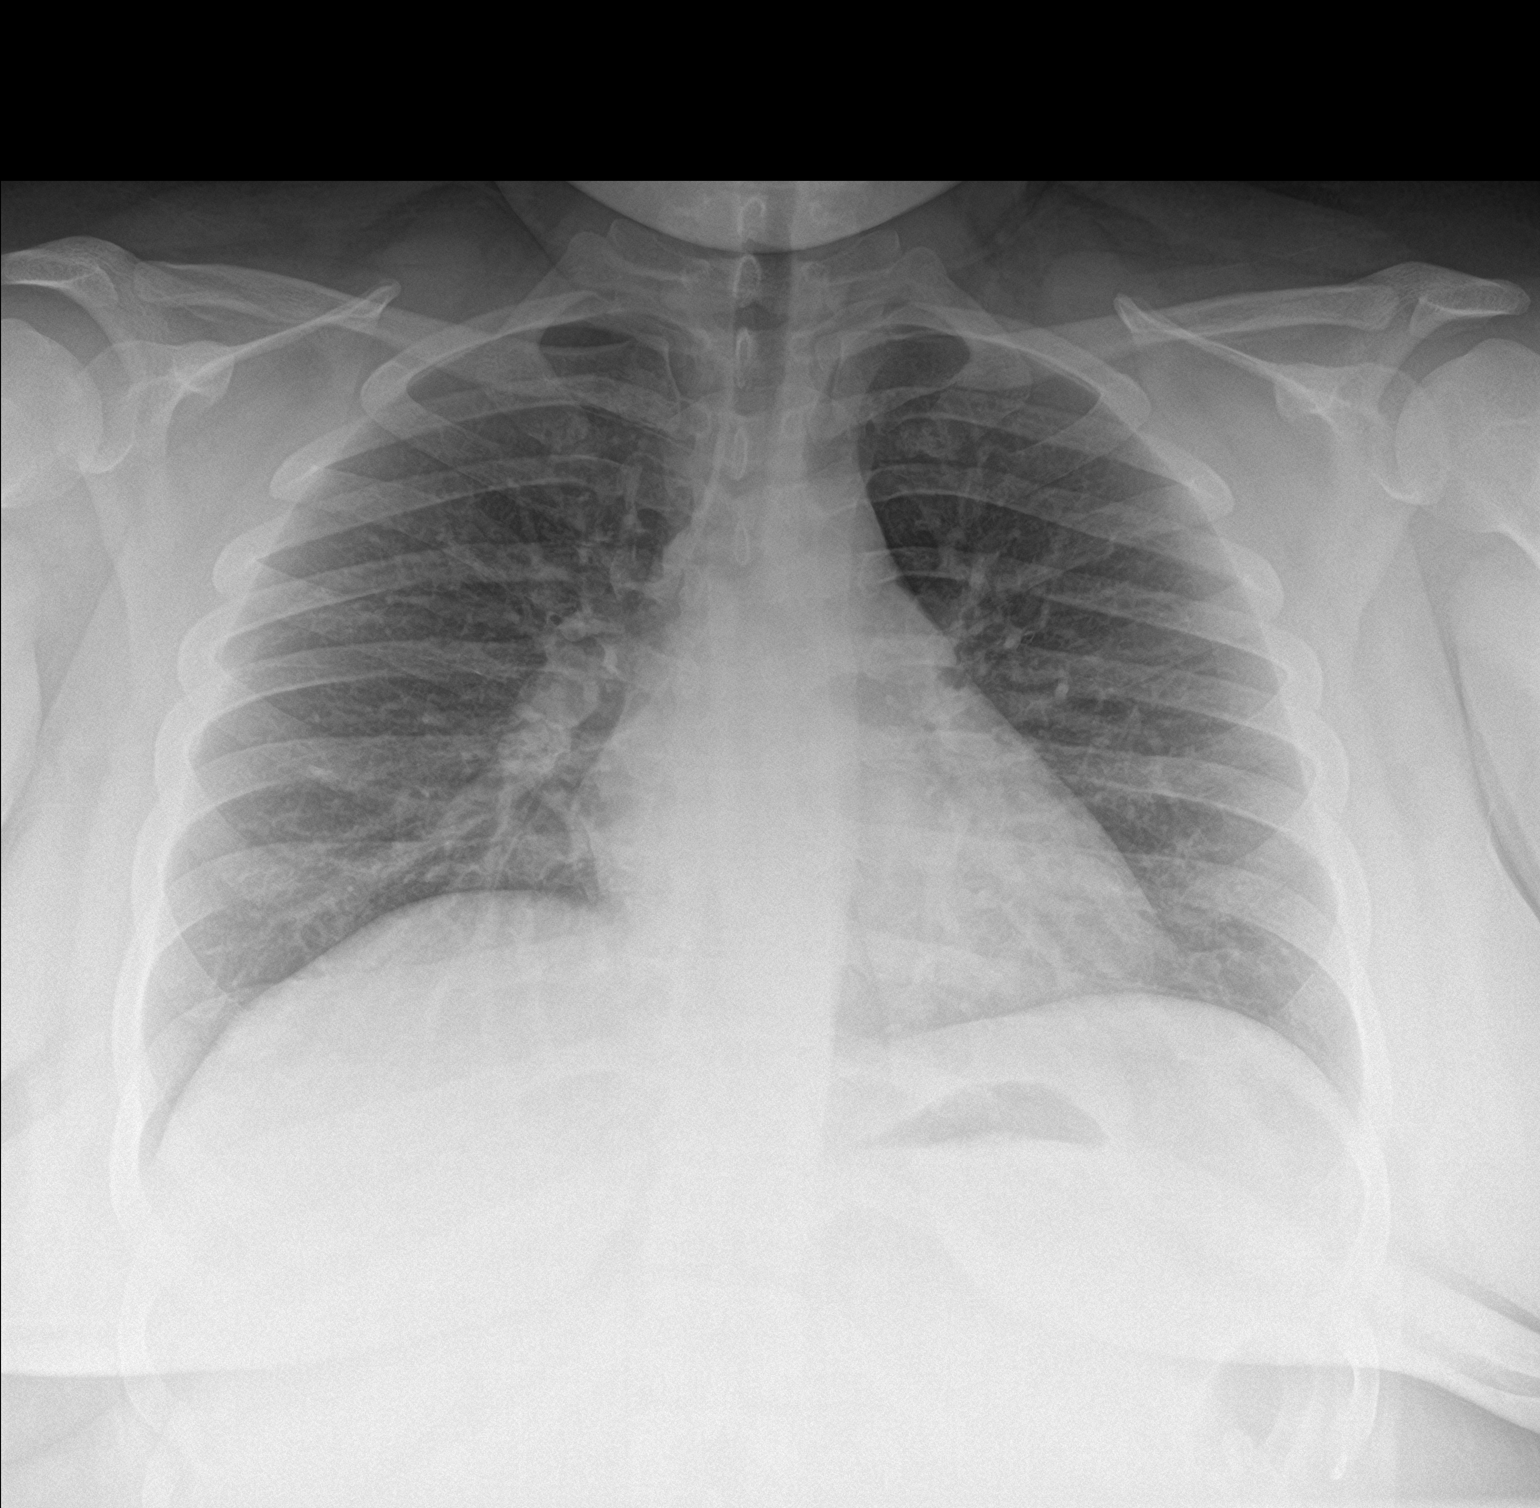

[chest lat]
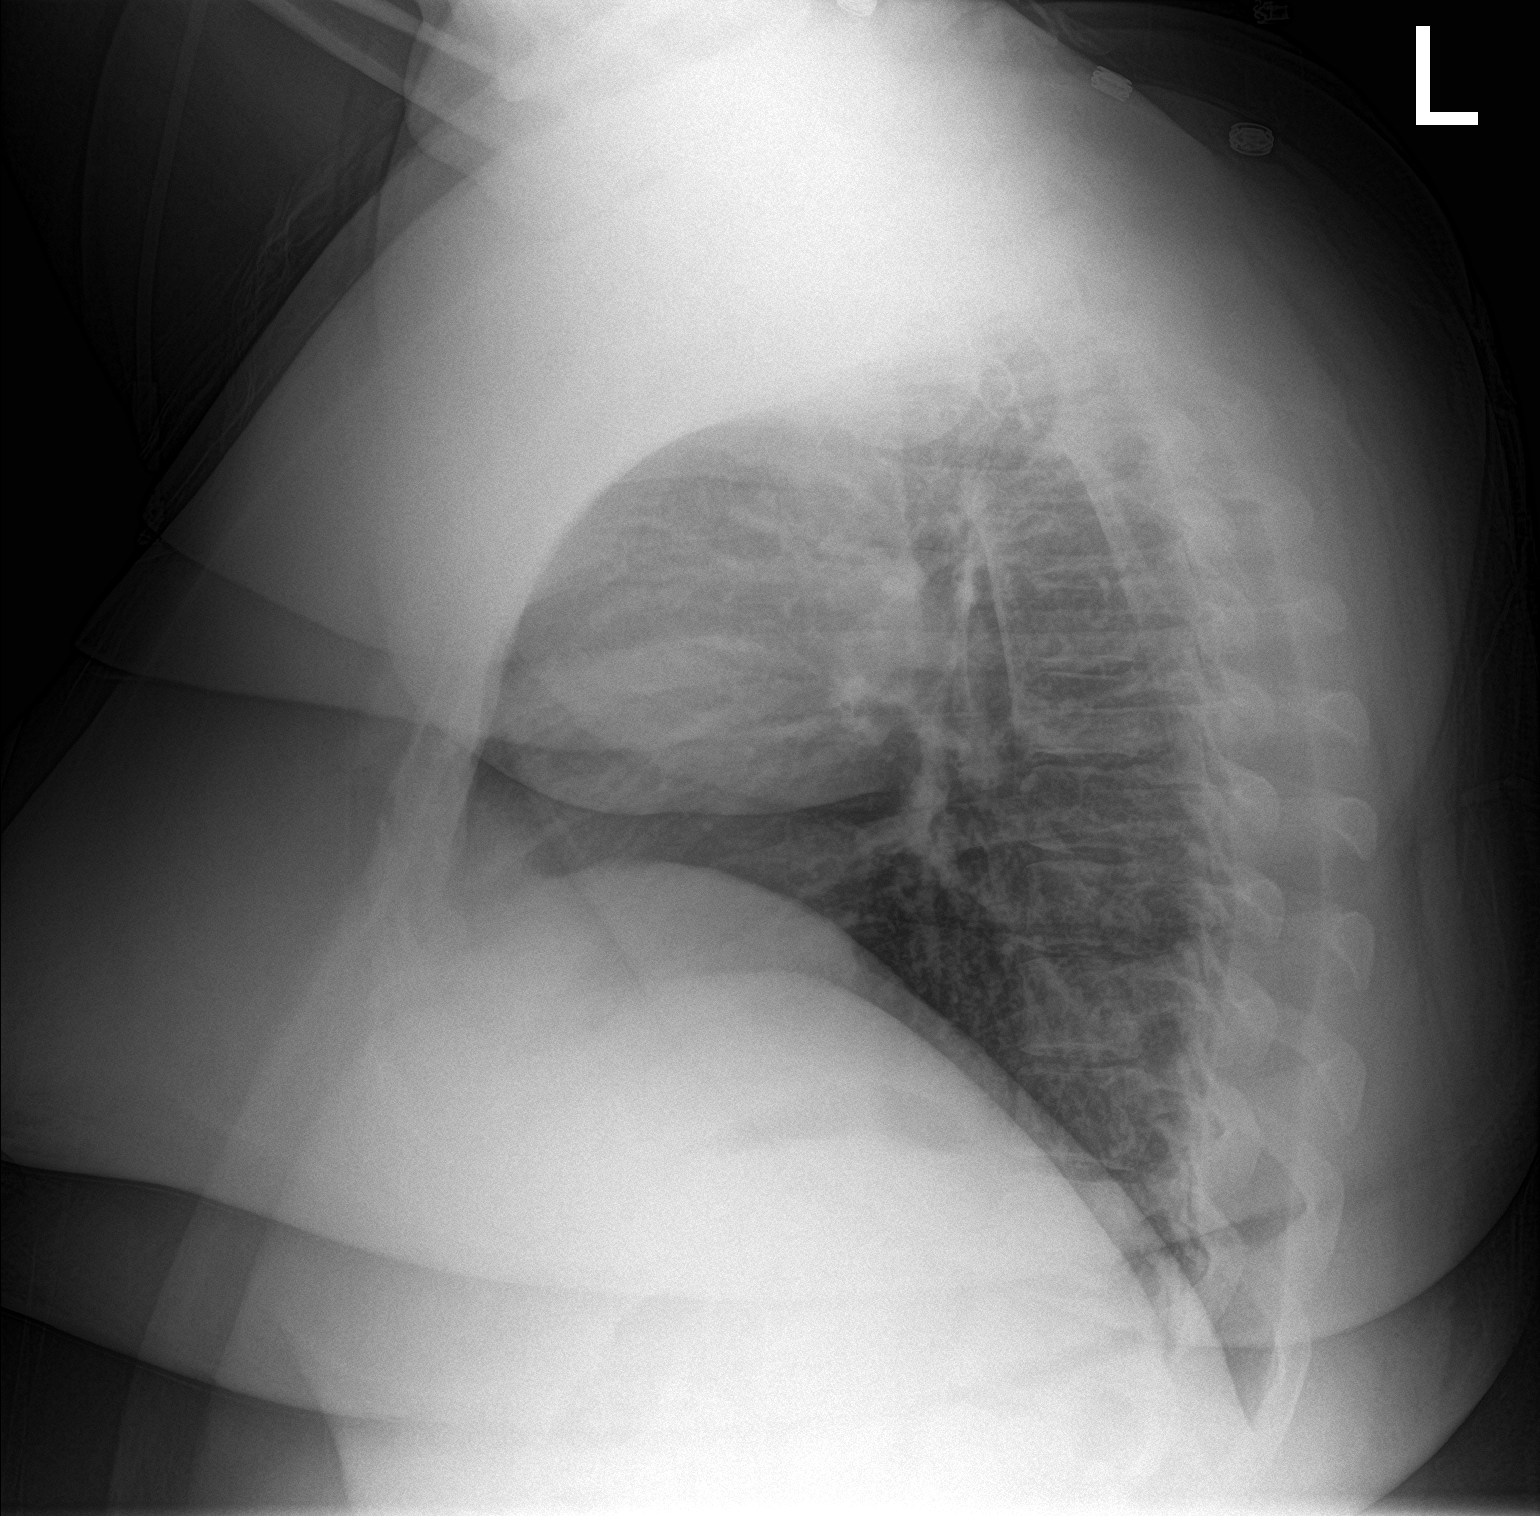

[2 of 2 positions shown; findings below may reference images not displayed]

FINDINGS: The heart size and mediastinal contours are within normal limits.
Both lungs are clear. The visualized skeletal structures are
unremarkable.
IMPRESSION: Normal chest x-ray.

## 2019-11-02 ENCOUNTER — Other Ambulatory Visit: Payer: Self-pay

## 2019-11-02 DIAGNOSIS — Z20822 Contact with and (suspected) exposure to covid-19: Secondary | ICD-10-CM

## 2019-11-04 LAB — NOVEL CORONAVIRUS, NAA: SARS-CoV-2, NAA: NOT DETECTED

## 2020-08-12 ENCOUNTER — Encounter: Payer: Self-pay | Admitting: Oncology

## 2020-08-12 ENCOUNTER — Other Ambulatory Visit: Payer: Self-pay | Admitting: Oncology

## 2020-08-12 ENCOUNTER — Ambulatory Visit (HOSPITAL_COMMUNITY)
Admission: RE | Admit: 2020-08-12 | Discharge: 2020-08-12 | Disposition: A | Discharge status: Home / Self Care | Payer: Medicaid Other | Source: Ambulatory Visit | Attending: Pulmonary Disease | Admitting: Pulmonary Disease

## 2020-08-12 DIAGNOSIS — U071 COVID-19: Secondary | ICD-10-CM | POA: Diagnosis present

## 2020-08-12 MED ORDER — METHYLPREDNISOLONE SODIUM SUCC 125 MG IJ SOLR: 125.0000 mg | Freq: Once | INTRAMUSCULAR | Status: DC | PRN

## 2020-08-12 MED ORDER — SODIUM CHLORIDE 0.9 % IV SOLN: INTRAVENOUS | Status: DC | PRN

## 2020-08-12 MED ORDER — DIPHENHYDRAMINE HCL 50 MG/ML IJ SOLN: 50.0000 mg | Freq: Once | INTRAMUSCULAR | Status: DC | PRN

## 2020-08-12 MED ORDER — SODIUM CHLORIDE 0.9 % IV SOLN
1200.0000 mg | Freq: Once | INTRAVENOUS | Status: AC
  Administered 2020-08-12: 1200 mg via INTRAVENOUS

## 2020-08-12 MED ORDER — EPINEPHRINE 0.3 MG/0.3ML IJ SOAJ: 0.3000 mg | Freq: Once | INTRAMUSCULAR | Status: DC | PRN

## 2020-08-12 MED ORDER — FAMOTIDINE IN NACL 20-0.9 MG/50ML-% IV SOLN: 20.0000 mg | Freq: Once | INTRAVENOUS | Status: DC | PRN

## 2020-08-12 MED ORDER — ALBUTEROL SULFATE HFA 108 (90 BASE) MCG/ACT IN AERS: 2.0000 | INHALATION_SPRAY | Freq: Once | RESPIRATORY_TRACT | Status: DC | PRN

## 2020-08-12 NOTE — Discharge Instructions (Signed)

## 2020-08-12 NOTE — Progress Notes (Addendum)
  Diagnosis: COVID-19  Physician: Dr. Shan Levans  Procedure: [facility] Med: casirivimab\imdevimab infusion - Provided patient with casirivimab\imdevimab fact sheet for patients, parents and caregivers prior to infusion.  Complications: No immediate complications noted.  Discharge: Discharged home   Pt able to eat and drink PO without difficulty.   Discussed deep breathing exercises with patient and instructed with patient teach back. Pt understands to take Tylenol PRN and hydrate. Pt states that she has been anxious about the virus as well as receiving the antibodies.  Rexene Alberts, NP aware of plan for discharging patient. No further orders at this time.  Cherlynn Perches 08/12/2020

## 2020-08-12 NOTE — Progress Notes (Signed)
I connected by phone with  Mrs. Danis to discuss the potential use of an new treatment for mild to moderate COVID-19 viral infection in non-hospitalized patients.   This patient is a age/sex that meets the FDA criteria for Emergency Use Authorization of casirivimab\imdevimab.  Has a (+) direct SARS-CoV-2 viral test result 1. Has mild or moderate COVID-19  2. Is ? 25 years of age and weighs ? 40 kg 3. Is NOT hospitalized due to COVID-19 4. Is NOT requiring oxygen therapy or requiring an increase in baseline oxygen flow rate due to COVID-19 5. Is within 10 days of symptom onset 6. Has at least one of the high risk factor(s) for progression to severe COVID-19 and/or hospitalization as defined in EUA. Specific high risk criteria : Past Medical History:  Diagnosis Date  . Anemia   . Diabetes mellitus without complication (HCC)   ?  ?    Symptom onset 08/05/2020   I have spoken and communicated the following to the patient or parent/caregiver:   1. FDA has authorized the emergency use of casirivimab\imdevimab for the treatment of mild to moderate COVID-19 in adults and pediatric patients with positive results of direct SARS-CoV-2 viral testing who are 41 years of age and older weighing at least 40 kg, and who are at high risk for progressing to severe COVID-19 and/or hospitalization.   2. The significant known and potential risks and benefits of casirivimab\imdevimab, and the extent to which such potential risks and benefits are unknown.   3. Information on available alternative treatments and the risks and benefits of those alternatives, including clinical trials.   4. Patients treated with casirivimab\imdevimab should continue to self-isolate and use infection control measures (e.g., wear mask, isolate, social distance, avoid sharing personal items, clean and disinfect "high touch" surfaces, and frequent handwashing) according to CDC guidelines.    5. The patient or parent/caregiver has the  option to accept or refuse casirivimab\imdevimab .   After reviewing this information with the patient, The patient agreed to proceed with receiving casirivimab\imdevimab infusion and will be provided a copy of the Fact sheet prior to receiving the infusion.Mignon Pine, AGNP-C [phone] (Infusion Center Hotline)

## 2021-08-12 ENCOUNTER — Ambulatory Visit
Admission: EM | Admit: 2021-08-12 | Discharge: 2021-08-12 | Disposition: A | Discharge status: Home / Self Care | Payer: Medicaid Other | Attending: Emergency Medicine | Admitting: Emergency Medicine

## 2021-08-12 ENCOUNTER — Other Ambulatory Visit: Payer: Self-pay

## 2021-08-12 DIAGNOSIS — R079 Chest pain, unspecified: Secondary | ICD-10-CM

## 2021-08-12 DIAGNOSIS — R9431 Abnormal electrocardiogram [ECG] [EKG]: Secondary | ICD-10-CM

## 2021-08-12 DIAGNOSIS — R519 Headache, unspecified: Secondary | ICD-10-CM

## 2021-08-12 DIAGNOSIS — E1165 Type 2 diabetes mellitus with hyperglycemia: Secondary | ICD-10-CM

## 2021-08-12 DIAGNOSIS — Z7984 Long term (current) use of oral hypoglycemic drugs: Secondary | ICD-10-CM

## 2021-08-12 DIAGNOSIS — M791 Myalgia, unspecified site: Secondary | ICD-10-CM

## 2021-08-12 DIAGNOSIS — R0602 Shortness of breath: Secondary | ICD-10-CM

## 2021-08-12 DIAGNOSIS — R739 Hyperglycemia, unspecified: Secondary | ICD-10-CM

## 2021-08-12 DIAGNOSIS — K209 Esophagitis, unspecified without bleeding: Secondary | ICD-10-CM

## 2021-08-12 DIAGNOSIS — B349 Viral infection, unspecified: Secondary | ICD-10-CM

## 2021-08-12 DIAGNOSIS — R81 Glycosuria: Secondary | ICD-10-CM

## 2021-08-12 LAB — POCT URINALYSIS DIP (MANUAL ENTRY)
Bilirubin, UA: NEGATIVE
Blood, UA: NEGATIVE
Glucose, UA: 500 mg/dL — AB
Ketones, POC UA: NEGATIVE mg/dL
Leukocytes, UA: NEGATIVE
Nitrite, UA: NEGATIVE
Protein Ur, POC: 30 mg/dL — AB
Spec Grav, UA: 1.025 (ref 1.010–1.025)
Urobilinogen, UA: 0.2 E.U./dL
pH, UA: 6 (ref 5.0–8.0)

## 2021-08-12 LAB — POCT INFLUENZA A/B
Influenza A, POC: NEGATIVE
Influenza B, POC: NEGATIVE

## 2021-08-12 LAB — POCT FASTING CBG KUC MANUAL ENTRY: POCT Glucose (KUC): 253 mg/dL — AB (ref 70–99)

## 2021-08-12 MED ORDER — SUCRALFATE 1 G PO TABS: 1.0000 g | ORAL_TABLET | Freq: Three times a day (TID) | ORAL | 0 refills | Status: DC

## 2021-08-12 NOTE — Discharge Instructions (Addendum)
Your rapid flu test today was negative.  The COVID/flu test that we sent off to the lab should be resulted in the next 12 to 24 hours and you will be contacted with those results.  Based on the history provided for me today and my physical exam findings, is most likely that you are experiencing influenza or influenza-like illness.  I recommend that you continue ibuprofen, Tylenol and Sudafed as needed for your symptoms.  Continue pushing clear fluids and rest and stay home until your symptoms are fully resolved.  For your sharp chest pain, I recommend that you try a medication called Carafate which is a chewable tablet that you take 4 times daily, once before each meal and once at bedtime.  If your symptoms improve then we can safely assume that your chest pain is due to inflammation in your esophagus possibly secondary to heartburn.  If they do not improve this will be an important finding to discuss with your primary.    Also as we discussed, I strongly encourage you to follow-up with your primary care physician for repeat diabetes testing and speak with her about improving your diabetes regimen.  You had a significant amount of sugar in your urine and your blood glucose level was 253 today.  With your reported recent weight loss, it is entirely possible that you may require the addition of insulin to your regimen.  Thank you for coming into urgent care today, it was a pleasure meeting you and I hope you feel better soon.

## 2021-08-12 NOTE — ED Provider Notes (Addendum)
[facility]    CSN: [id number] Arrival date & time: 08/12/21  1140      History   Chief Complaint Chief Complaint  Patient presents with   Shortness of Breath   Nasal Congestion   Generalized Body Aches    HPI Julie Banks is a 25 y.o. female.   Patient presents to urgent care today complaining of congestion, nausea, diarrhea, headache, body aches, temperature 101.2 yesterday, chest pain and shortness of breath with activity and rest.  States her symptoms began on 4 days ago.  Patient states the chest pain is sharp, radiates from her chest to her neck.  EKG today was concerning for history of anterior infarct age undetermined.  Patient states she has been taking Sudafed, Tylenol and ibuprofen for her headache which has helped.  Per EMR, patient's last A1c was 7.1 in 2020, patient has not had good follow-up for her type 2 diabetes, patient's blood glucose in office today was 253, patient states that this time the community clinic where she is being followed is prescribing her glipizide 10 mg and metformin 250 twice daily despite patient not being seen since 2020.  Patient states she has not taken either medication this morning because she has not yet yet eaten.   Shortness of Breath  Past Medical History:  Diagnosis Date   Anemia    Diabetes mellitus without complication [facility])     Patient Active Problem List   Diagnosis Date Noted   Other insomnia 03/16/2019   Anxiety and depression 03/16/2019   Menorrhagia with irregular cycle 03/16/2019   Diabetes mellitus type 2, uncontrolled, with complications ([place]) 40/98/1191    History reviewed. No pertinent surgical history.  OB History     Gravida  0   Para  0   Term  0   Preterm  0   AB  0   Living  0      SAB  0   IAB  0   Ectopic  0   Multiple  0   Live Births               Home Medications    Prior to Admission medications   Medication Sig Start Date End Date Taking? Authorizing  Provider  sucralfate (CARAFATE) 1 g tablet Take 1 tablet (1 g total) by mouth 4 (four) times daily -  with meals and at bedtime for 14 days. 08/12/21 08/26/21 Yes Lynden Oxford Scales, PA-C  amitriptyline (ELAVIL) 25 MG tablet Take 1 tablet (25 mg total) by mouth at bedtime as needed for sleep. 04/05/19 07/04/19  Gildardo Pounds, NP  atorvastatin (LIPITOR) 20 MG tablet Take 1 tablet (20 mg total) by mouth daily. 03/16/19   Gildardo Pounds, NP  Blood Glucose Monitoring Suppl (TRUE METRIX METER) w/Device KIT Check blood sugar fasting daily 02/02/19   Argentina Donovan, PA-C  cetirizine (ZYRTEC) 10 MG tablet Take 1 tablet (10 mg total) by mouth daily. 02/02/19   Argentina Donovan, PA-C  cycloSPORINE (RESTASIS) 0.05 % ophthalmic emulsion Place 2 drops into both eyes daily.     [provider]  diphenhydrAMINE (BENADRYL) 25 MG tablet Take 1 tablet (25 mg total) by mouth every 6 (six) hours as needed. Patient not taking: Reported on 02/02/2019 01/21/19   Wurst, [place], PA-C  Diphenhydramine-APAP, sleep, (MIDOL PM PO) Take 2 tablets by mouth every 4 (four) hours as needed (for cramping).    [provider]  ferrous fumarate (HEMOCYTE - 106  MG FE) 325 (106 Fe) MG TABS tablet Take 1 tablet (106 mg of iron total) by mouth 2 (two) times daily. 02/05/19   Argentina Donovan, PA-C  glucose blood (TRUE METRIX BLOOD GLUCOSE TEST) test strip Use as instructed 02/02/19   Argentina Donovan, PA-C  levonorgestrel-ethinyl estradiol (ALESSE) 0.1-20 MG-MCG tablet Take 1 tablet by mouth daily. 04/05/19   Gildardo Pounds, NP  metFORMIN (GLUCOPHAGE) 500 MG tablet Take 1 tablet (500 mg total) by mouth 2 (two) times daily with a meal. 02/02/19   McClung, Dionne Bucy, PA-C  traZODone (DESYREL) 50 MG tablet Take 1 tablet (50 mg total) by mouth at bedtime for 30 days. 03/15/19 04/14/19  Gildardo Pounds, NP  TRUEplus Lancets 28G MISC 1 each by Does not apply route daily. 02/02/19   Argentina Donovan, PA-C    Family  History Family History  Problem Relation Age of Onset   Diabetes Mother    Diabetes Sister    Diabetes Maternal Uncle     Social History Social History   Tobacco Use   Smoking status: Never   Smokeless tobacco: Never  Vaping Use   Vaping Use: Never used  Substance Use Topics   Alcohol use: No   Drug use: No     Allergies   Patient has no known allergies.   Review of Systems Review of Systems Per HPI  Physical Exam Triage Vital Signs ED Triage Vitals  Enc Vitals Group     BP 08/12/21 1215 (!) 146/92     Pulse Rate 08/12/21 1215 84     Resp 08/12/21 1215 (!) 22     Temp 08/12/21 1215 99 F (37.2 C)     Temp Source 08/12/21 1215 Oral     SpO2 08/12/21 1215 96 %     Weight --      Height --      Head Circumference --      Peak Flow --      Pain Score 08/12/21 1213 7     Pain Loc --      Pain Edu? --      Excl. in [place]? --    No data found.  Updated Vital Signs BP (!) 146/92 (BP Location: Right Arm)   Pulse 84   Temp 99 F (37.2 C) (Oral)   Resp (!) 22   LMP 05/11/2021 (Approximate)   SpO2 96%   Visual Acuity Right Eye Distance:   Left Eye Distance:   Bilateral Distance:    Right Eye Near:   Left Eye Near:    Bilateral Near:     Physical Exam Vitals and nursing note reviewed.  Constitutional:      Appearance: Normal appearance. She is obese. She is ill-appearing.  HENT:     Head: Normocephalic and atraumatic.     Right Ear: Tympanic membrane, ear canal and external ear normal.     Left Ear: Tympanic membrane, ear canal and external ear normal.     Nose: Nose normal.     Mouth/Throat:     Mouth: Mucous membranes are moist.     Pharynx: Oropharynx is clear. No pharyngeal swelling or oropharyngeal exudate.  Eyes:     Extraocular Movements: Extraocular movements intact.     Conjunctiva/sclera: Conjunctivae normal.     Pupils: Pupils are equal, round, and reactive to light.  Cardiovascular:     Rate and Rhythm: Normal rate and regular rhythm.      Pulses: Normal pulses.  Heart sounds: Normal heart sounds.  Pulmonary:     Effort: Pulmonary effort is normal.     Breath sounds: Examination of the right-upper field reveals rales. Rales present. No decreased breath sounds, wheezing or rhonchi.  Chest:     Chest wall: No tenderness.  Abdominal:     General: Abdomen is flat. Bowel sounds are normal.     Palpations: Abdomen is soft.  Musculoskeletal:        General: Normal range of motion.     Cervical back: Normal range of motion and neck supple.  Lymphadenopathy:     Cervical: Cervical adenopathy present.  Skin:    General: Skin is warm and dry.  Neurological:     General: No focal deficit present.     Mental Status: She is alert and oriented to person, place, and time. Mental status is at baseline.  Psychiatric:        Mood and Affect: Mood normal.        Behavior: Behavior normal.     UC Treatments / Results  Labs (all labs ordered are listed, but only abnormal results are displayed) Labs Reviewed  POCT FASTING CBG [facility] MANUAL ENTRY - Abnormal; Notable for the following components:      Result Value   POCT Glucose ([facility]) 253 (*)    All other components within normal limits  POCT URINALYSIS DIP (MANUAL ENTRY) - Abnormal; Notable for the following components:   Glucose, UA =500 (*)    Protein Ur, POC =30 (*)    All other components within normal limits  COVID-19, FLU A+B NAA  POCT INFLUENZA A/B    EKG   Radiology No results found.  Procedures Procedures (including critical care time)  Medications Ordered in UC Medications - No data to display  Initial Impression / Assessment and Plan / UC Course  I have reviewed the triage vital signs and the nursing notes.  Pertinent labs & imaging results that were available during my care of the patient were reviewed by me and considered in my medical decision making (see chart for details).    Uncontrolled type 2 diabetes: Patient has been advised to follow-up  with primary care for routine follow-up of her type 2 diabetes.  Upper respiratory infection: Based on patient history and my physical exam findings, most likely due to influenza.  COVID/flu swab has been sent to lab, patient will be advised of results once received.  Patient has been advised it is okay to continue Sudafed, ibuprofen and Tylenol for her symptoms.  Final Clinical Impressions(s) / UC Diagnoses   Final diagnoses:  Myalgia  Shortness of breath  Nonintractable headache, unspecified chronicity pattern, unspecified headache type  Chest pain, unspecified type  Uncontrolled type 2 diabetes mellitus with hyperglycemia ([place])  Type 2 diabetes mellitus with hyperglycemia, without long-term current use of insulin (HCC)  Nonspecific abnormal electrocardiogram (ECG) (EKG)  Glucosuria  Hyperglycemia  Acute esophagitis  Viral illness     Discharge Instructions      Your rapid flu test today was negative.  The COVID/flu test that we sent off to the lab should be resulted in the next 12 to 24 hours and you will be contacted with those results.  Based on the history provided for me today and my physical exam findings, is most likely that you are experiencing influenza or influenza-like illness.  I recommend that you continue ibuprofen, Tylenol and Sudafed as needed for your symptoms.  Continue pushing clear fluids and rest and  stay home until your symptoms are fully resolved.  For your sharp chest pain, I recommend that you try a medication called Carafate which is a chewable tablet that you take 4 times daily, once before each meal and once at bedtime.  If your symptoms improve then we can safely assume that your chest pain is due to inflammation in your esophagus possibly secondary to heartburn.  If they do not improve this will be an important finding to discuss with your primary.    Also as we discussed, I strongly encourage you to follow-up with your primary care physician for repeat  diabetes testing and speak with her about improving your diabetes regimen.  You had a significant amount of sugar in your urine and your blood glucose level was 253 today.  With your reported recent weight loss, it is entirely possible that you may require the addition of insulin to your regimen.  Thank you for coming into urgent care today, it was a pleasure meeting you and I hope you feel better soon.     ED Prescriptions     Medication Sig Dispense Auth. Provider   sucralfate (CARAFATE) 1 g tablet Take 1 tablet (1 g total) by mouth 4 (four) times daily -  with meals and at bedtime for 14 days. 56 tablet Lynden Oxford Scales, PA-C      PDMP not reviewed this encounter.   Lynden Oxford Scales, PA-C 08/12/21 1315    Lynden Oxford [place], PA-C 08/12/21 1316    Lynden Oxford [place], [place] 08/12/21 1327

## 2021-08-12 NOTE — ED Triage Notes (Signed)
Pt reports having congestion, body aches, chest pain, SOB ( with and without activity). Symptoms began last Friday.   Chest pain is sharp and more soreness, pt states pain radiates from medial to neck. She does have a headache and eye soreness.   Sudafed, tylenol and ibuprofen for the headache- medications were helpful

## 2021-08-13 LAB — COVID-19, FLU A+B NAA
Influenza A, NAA: NOT DETECTED
Influenza B, NAA: NOT DETECTED
SARS-CoV-2, NAA: NOT DETECTED

## 2021-09-01 ENCOUNTER — Ambulatory Visit (HOSPITAL_COMMUNITY)
Admission: EM | Admit: 2021-09-01 | Discharge: 2021-09-01 | Disposition: A | Discharge status: Home / Self Care | Payer: Self-pay | Attending: Internal Medicine | Admitting: Internal Medicine

## 2021-09-01 ENCOUNTER — Encounter (HOSPITAL_COMMUNITY): Payer: Self-pay

## 2021-09-01 ENCOUNTER — Other Ambulatory Visit: Payer: Self-pay

## 2021-09-01 DIAGNOSIS — J029 Acute pharyngitis, unspecified: Secondary | ICD-10-CM

## 2021-09-01 LAB — POCT INFECTIOUS MONO SCREEN, ED / UC: Mono Screen: NEGATIVE

## 2021-09-01 MED ORDER — MOUTHWASH COMPOUNDING BASE PO LIQD: 15.0000 mL | Freq: Three times a day (TID) | ORAL | 0 refills | Status: DC | PRN

## 2021-09-01 NOTE — Discharge Instructions (Signed)
Increase oral fluid intake Tylenol/Motrin as needed for pain Take medications as prescribed Return to urgent care if symptoms worsens.

## 2021-09-01 NOTE — ED Triage Notes (Signed)
Pt presents with sore throat X 1 week. 

## 2021-09-02 NOTE — ED Provider Notes (Signed)
[facility]    CSN: [id number] Arrival date & time: 09/01/21  1649      History   Chief Complaint Chief Complaint  Patient presents with   Sore Throat    HPI Julie Banks is a 25 y.o. female comes to urgent care with 1 week history of sore throat.  Symptoms started fairly abruptly and has been persistent.  Patient denies any fever, chills or body aches.  No sick contacts.  No nausea or vomiting.  Sore throat is aggravated by swallowing.  No abdominal pain.  No body aches. HPI  Past Medical History:  Diagnosis Date   Anemia    Diabetes mellitus without complication [facility])     Patient Active Problem List   Diagnosis Date Noted   Other insomnia 03/16/2019   Anxiety and depression 03/16/2019   Menorrhagia with irregular cycle 03/16/2019   Diabetes mellitus type 2, uncontrolled, with complications 71/21/9758    History reviewed. No pertinent surgical history.  OB History     Gravida  0   Para  0   Term  0   Preterm  0   AB  0   Living  0      SAB  0   IAB  0   Ectopic  0   Multiple  0   Live Births               Home Medications    Prior to Admission medications   Medication Sig Start Date End Date Taking? Authorizing Provider  Mouthwash Compounding Base LIQD Swish and spit 15 mLs 3 (three) times daily as needed (sorethroat). Compounding formula: Lidocaine viscous 2% 60 mL, Maalox 73m, Benadryl 12.5 mg/524m60 mL 09/01/21  Yes Breslin Burklow, PhMyrene GalasMD  amitriptyline (ELAVIL) 25 MG tablet Take 1 tablet (25 mg total) by mouth at bedtime as needed for sleep. 04/05/19 07/04/19  FlGildardo PoundsNP  atorvastatin (LIPITOR) 20 MG tablet Take 1 tablet (20 mg total) by mouth daily. 03/16/19   FlGildardo PoundsNP  Blood Glucose Monitoring Suppl (TRUE METRIX METER) w/Device KIT Check blood sugar fasting daily 02/02/19   McArgentina DonovanPA-C  cetirizine (ZYRTEC) 10 MG tablet Take 1 tablet (10 mg total) by mouth daily. 02/02/19   McArgentina Donovan PA-C  cycloSPORINE (RESTASIS) 0.05 % ophthalmic emulsion Place 2 drops into both eyes daily.     [provider]  diphenhydrAMINE (BENADRYL) 25 MG tablet Take 1 tablet (25 mg total) by mouth every 6 (six) hours as needed. Patient not taking: Reported on 02/02/2019 01/21/19   Wurst, Br[place]PA-C  Diphenhydramine-APAP, sleep, (MIDOL PM PO) Take 2 tablets by mouth every 4 (four) hours as needed (for cramping).    [provider]  ferrous fumarate (HEMOCYTE - 106 MG FE) 325 (106 Fe) MG TABS tablet Take 1 tablet (106 mg of iron total) by mouth 2 (two) times daily. 02/05/19   McArgentina DonovanPA-C  glucose blood (TRUE METRIX BLOOD GLUCOSE TEST) test strip Use as instructed 02/02/19   McArgentina DonovanPA-C  levonorgestrel-ethinyl estradiol (ALESSE) 0.1-20 MG-MCG tablet Take 1 tablet by mouth daily. 04/05/19   FlGildardo PoundsNP  metFORMIN (GLUCOPHAGE) 500 MG tablet Take 1 tablet (500 mg total) by mouth 2 (two) times daily with a meal. 02/02/19   McClung, AnDionne BucyPA-C  sucralfate (CARAFATE) 1 g tablet Take 1 tablet (1 g total) by mouth 4 (four) times daily -  with meals  and at bedtime for 14 days. 08/12/21 08/26/21  Lynden Oxford Scales, PA-C  traZODone (DESYREL) 50 MG tablet Take 1 tablet (50 mg total) by mouth at bedtime for 30 days. 03/15/19 04/14/19  Gildardo Pounds, NP  TRUEplus Lancets 28G MISC 1 each by Does not apply route daily. 02/02/19   Argentina Donovan, PA-C    Family History Family History  Problem Relation Age of Onset   Diabetes Mother    Diabetes Sister    Diabetes Maternal Uncle     Social History Social History   Tobacco Use   Smoking status: Never   Smokeless tobacco: Never  Vaping Use   Vaping Use: Never used  Substance Use Topics   Alcohol use: No   Drug use: No     Allergies   Patient has no known allergies.   Review of Systems Review of Systems  Constitutional:  Negative for chills and fever.  HENT:  Positive for sore throat. Negative  for congestion, ear discharge, ear pain and postnasal drip.   Respiratory: Negative.    Cardiovascular: Negative.   Gastrointestinal: Negative.     Physical Exam Triage Vital Signs ED Triage Vitals  Enc Vitals Group     BP 09/01/21 1846 130/83     Pulse Rate 09/01/21 1846 91     Resp 09/01/21 1846 20     Temp 09/01/21 1846 98.7 F (37.1 C)     Temp Source 09/01/21 1846 Oral     SpO2 09/01/21 1846 95 %     Weight --      Height --      Head Circumference --      Peak Flow --      Pain Score 09/01/21 1849 6     Pain Loc --      Pain Edu? --      Excl. in [place]? --    No data found.  Updated Vital Signs BP 130/83 (BP Location: Right Arm)   Pulse 91   Temp 98.7 F (37.1 C) (Oral)   Resp 20   LMP  (LMP Unknown)   SpO2 95%   Visual Acuity Right Eye Distance:   Left Eye Distance:   Bilateral Distance:    Right Eye Near:   Left Eye Near:    Bilateral Near:     Physical Exam Vitals and nursing note reviewed.  Constitutional:      General: She is not in acute distress.    Appearance: She is obese. She is not ill-appearing.  HENT:     Right Ear: Tympanic membrane normal.     Left Ear: Tympanic membrane normal.     Mouth/Throat:     Mouth: Mucous membranes are moist.     Pharynx: Posterior oropharyngeal erythema present.     Tonsils: 1+ on the right. 1+ on the left.  Cardiovascular:     Rate and Rhythm: Normal rate and regular rhythm.  Neurological:     Mental Status: She is alert.     UC Treatments / Results  Labs (all labs ordered are listed, but only abnormal results are displayed) Labs Reviewed  POCT INFECTIOUS MONO SCREEN, ED / UC    EKG   Radiology No results found.  Procedures Procedures (including critical care time)  Medications Ordered in UC Medications - No data to display  Initial Impression / Assessment and Plan / UC Course  I have reviewed the triage vital signs and the nursing notes.  Pertinent labs &  imaging results that were  available during my care of the patient were reviewed by me and considered in my medical decision making (see chart for details).     1.  Acute viral pharyngitis: Monospot screen is negative Patient is advised to do warm salt water gargle Mouthwash gargle to help with throat pain Tylenol/Motrin as needed for fever and/body aches Return to urgent care if symptoms worsen. Final Clinical Impressions(s) / UC Diagnoses   Final diagnoses:  Viral pharyngitis     Discharge Instructions      Increase oral fluid intake Tylenol/Motrin as needed for pain Take medications as prescribed Return to urgent care if symptoms worsens.   ED Prescriptions     Medication Sig Dispense Auth. Provider   Mouthwash Compounding Base LIQD Swish and spit 15 mLs 3 (three) times daily as needed (sorethroat). Compounding formula: Lidocaine viscous 2% 60 mL, Maalox 24m, Benadryl 12.5 mg/539m60 mL 180 mL Fredrico Beedle, PhMyrene GalasMD      PDMP not reviewed this encounter.   LaChase PicketMD 09/02/21 [id number]

## 2022-10-13 ENCOUNTER — Other Ambulatory Visit: Payer: Self-pay

## 2022-10-13 ENCOUNTER — Ambulatory Visit
Admission: EM | Admit: 2022-10-13 | Discharge: 2022-10-13 | Disposition: A | Discharge status: Home / Self Care | Payer: Self-pay | Attending: Physician Assistant | Admitting: Physician Assistant

## 2022-10-13 DIAGNOSIS — Z1152 Encounter for screening for COVID-19: Secondary | ICD-10-CM | POA: Insufficient documentation

## 2022-10-13 DIAGNOSIS — H65192 Other acute nonsuppurative otitis media, left ear: Secondary | ICD-10-CM | POA: Insufficient documentation

## 2022-10-13 DIAGNOSIS — J069 Acute upper respiratory infection, unspecified: Secondary | ICD-10-CM | POA: Insufficient documentation

## 2022-10-13 LAB — RESP PANEL BY RT-PCR (FLU A&B, COVID) ARPGX2
Influenza A by PCR: NEGATIVE
Influenza B by PCR: NEGATIVE
SARS Coronavirus 2 by RT PCR: POSITIVE — AB

## 2022-10-13 MED ORDER — AMOXICILLIN 500 MG PO CAPS
500.0000 mg | ORAL_CAPSULE | Freq: Three times a day (TID) | ORAL | 0 refills | Status: DC
  Filled 2022-10-13: qty 21, 7d supply, fill #0

## 2022-10-13 NOTE — ED Triage Notes (Signed)
Pt presents to uc with co cough, congestion, sore throat, ha, runny nose, since Sunday night and new onset of L sided otalgia. Pt reports pain is 3/10 with tylenol and Advil and sudafed.

## 2022-10-13 NOTE — ED Provider Notes (Signed)
[facility]    CSN: [id number] Arrival date & time: 10/13/22  7017      History   Chief Complaint Chief Complaint  Patient presents with   Otalgia   URI    HPI Julie Banks is a 26 y.o. female.   Patient here today for evaluation of cough, congestion, sore throat, headache and runny nose that started 2 days ago.  She reports that she does have some left-sided ear pain that is developed.  She has taken Tylenol, Advil and Sudafed with mild relief.  She reports that her sister was diagnosed with COVID about 10 days ago, and her nephew recently had strep.  She notes that she has had some vomiting but no diarrhea.  The history is provided by the patient.    Past Medical History:  Diagnosis Date   Anemia    Diabetes mellitus without complication [facility])     Patient Active Problem List   Diagnosis Date Noted   Other insomnia 03/16/2019   Anxiety and depression 03/16/2019   Menorrhagia with irregular cycle 03/16/2019   Diabetes mellitus type 2, uncontrolled, with complications 79/39/0300    History reviewed. No pertinent surgical history.  OB History     Gravida  0   Para  0   Term  0   Preterm  0   AB  0   Living  0      SAB  0   IAB  0   Ectopic  0   Multiple  0   Live Births               Home Medications    Prior to Admission medications   Medication Sig Start Date End Date Taking? Authorizing Provider  amoxicillin (AMOXIL) 500 MG capsule Take 1 capsule (500 mg total) by mouth 3 (three) times daily. 10/13/22  Yes Francene Finders, PA-C  amitriptyline (ELAVIL) 25 MG tablet Take 1 tablet (25 mg total) by mouth at bedtime as needed for sleep. Patient not taking: Reported on 10/13/2022 04/05/19 07/04/19  Gildardo Pounds, NP  atorvastatin (LIPITOR) 20 MG tablet Take 1 tablet (20 mg total) by mouth daily. Patient not taking: Reported on 10/13/2022 03/16/19   Gildardo Pounds, NP  Blood Glucose Monitoring Suppl (TRUE METRIX METER)  w/Device KIT Check blood sugar fasting daily Patient not taking: Reported on 10/13/2022 02/02/19   Argentina Donovan, PA-C  cetirizine (ZYRTEC) 10 MG tablet Take 1 tablet (10 mg total) by mouth daily. Patient not taking: Reported on 10/13/2022 02/02/19   Argentina Donovan, PA-C  cycloSPORINE (RESTASIS) 0.05 % ophthalmic emulsion Place 2 drops into both eyes daily.     [provider]  diphenhydrAMINE (BENADRYL) 25 MG tablet Take 1 tablet (25 mg total) by mouth every 6 (six) hours as needed. Patient not taking: Reported on 02/02/2019 01/21/19   Wurst, [place], PA-C  Diphenhydramine-APAP, sleep, (MIDOL PM PO) Take 2 tablets by mouth every 4 (four) hours as needed (for cramping). Patient not taking: Reported on 10/13/2022    [provider]  ferrous fumarate (HEMOCYTE - 106 MG FE) 325 (106 Fe) MG TABS tablet Take 1 tablet (106 mg of iron total) by mouth 2 (two) times daily. Patient not taking: Reported on 10/13/2022 02/05/19   Argentina Donovan, PA-C  glucose blood (TRUE METRIX BLOOD GLUCOSE TEST) test strip Use as instructed Patient not taking: Reported on 10/13/2022 02/02/19   Argentina Donovan, PA-C  levonorgestrel-ethinyl estradiol (ALESSE) 0.1-20  MG-MCG tablet Take 1 tablet by mouth daily. 04/05/19   Gildardo Pounds, NP  metFORMIN (GLUCOPHAGE) 500 MG tablet Take 1 tablet (500 mg total) by mouth 2 (two) times daily with a meal. Patient not taking: Reported on 10/13/2022 02/02/19   Argentina Donovan, PA-C  Mouthwash Compounding Base LIQD Swish and spit 15 mLs 3 (three) times daily as needed (sorethroat). Compounding formula: Lidocaine viscous 2% 60 mL, Maalox 36m, Benadryl 12.5 mg/545m60 mL Patient not taking: Reported on 10/13/2022 09/01/21   LaChase PicketMD  sucralfate (CARAFATE) 1 g tablet Take 1 tablet (1 g total) by mouth 4 (four) times daily -  with meals and at bedtime for 14 days. Patient not taking: Reported on 10/13/2022 08/12/21 08/26/21  MoLynden Oxfordcales, PA-C   traZODone (DESYREL) 50 MG tablet Take 1 tablet (50 mg total) by mouth at bedtime for 30 days. Patient not taking: Reported on 10/13/2022 03/15/19 04/14/19  FlGildardo PoundsNP  TRUEplus Lancets 28G MISC 1 each by Does not apply route daily. Patient not taking: Reported on 10/13/2022 02/02/19   McArgentina DonovanPA-C    Family History Family History  Problem Relation Age of Onset   Diabetes Mother    Diabetes Sister    Diabetes Maternal Uncle     Social History Social History   Tobacco Use   Smoking status: Never   Smokeless tobacco: Never  Vaping Use   Vaping Use: Never used  Substance Use Topics   Alcohol use: No   Drug use: No     Allergies   Patient has no known allergies.   Review of Systems Review of Systems  Constitutional:  Negative for chills and fever.  HENT:  Positive for congestion, ear pain and sore throat.   Eyes:  Negative for discharge and redness.  Respiratory:  Positive for cough. Negative for shortness of breath and wheezing.   Gastrointestinal:  Positive for vomiting. Negative for abdominal pain and diarrhea.     Physical Exam Triage Vital Signs ED Triage Vitals  Enc Vitals Group     BP      Pulse      Resp      Temp      Temp src      SpO2      Weight      Height      Head Circumference      Peak Flow      Pain Score      Pain Loc      Pain Edu?      [place]   No data found.  Updated Vital Signs BP (!) 175/86   Pulse 98   Temp 98 F (36.7 C)   Resp 18   LMP 09/26/2022 (Exact Date)   SpO2 98%      Physical Exam Vitals and nursing note reviewed.  Constitutional:      General: She is not in acute distress.    Appearance: Normal appearance. She is not ill-appearing.  HENT:     Head: Normocephalic and atraumatic.     Right Ear: Tympanic membrane normal.     Ears:     Comments: Left TM erythematous    Nose: Congestion present.     Mouth/Throat:     Mouth: Mucous membranes are moist.     Pharynx: Posterior  oropharyngeal erythema present. No oropharyngeal exudate.  Eyes:     Conjunctiva/sclera: Conjunctivae normal.  Cardiovascular:  Rate and Rhythm: Normal rate and regular rhythm.     Heart sounds: Normal heart sounds. No murmur heard. Pulmonary:     Effort: Pulmonary effort is normal. No respiratory distress.     Breath sounds: Normal breath sounds. No wheezing, rhonchi or rales.  Skin:    General: Skin is warm and dry.  Neurological:     Mental Status: She is alert.  Psychiatric:        Mood and Affect: Mood normal.        Thought Content: Thought content normal.      UC Treatments / Results  Labs (all labs ordered are listed, but only abnormal results are displayed) Labs Reviewed  RESP PANEL BY RT-PCR (FLU A&B, COVID) ARPGX2    EKG   Radiology No results found.  Procedures Procedures (including critical care time)  Medications Ordered in UC Medications - No data to display  Initial Impression / Assessment and Plan / UC Course  I have reviewed the triage vital signs and the nursing notes.  Pertinent labs & imaging results that were available during my care of the patient were reviewed by me and considered in my medical decision making (see chart for details).    Amoxicillin prescribed for otitis media treatment and screening ordered for covid and flu as I suspect other symptoms are viral in nature. Encouraged symptomatic treatment and rest with increased fluids. Recommend follow up with any further concerns.   Final Clinical Impressions(s) / UC Diagnoses   Final diagnoses:  Acute upper respiratory infection  Encounter for screening for COVID-19  Other acute nonsuppurative otitis media of left ear, recurrence not specified   Discharge Instructions   None    ED Prescriptions     Medication Sig Dispense Auth. Provider   amoxicillin (AMOXIL) 500 MG capsule Take 1 capsule (500 mg total) by mouth 3 (three) times daily. 21 capsule Francene Finders, PA-C       PDMP not reviewed this encounter.   Francene Finders, PA-C 10/13/22 [phone]

## 2022-11-08 ENCOUNTER — Ambulatory Visit
Admission: EM | Admit: 2022-11-08 | Discharge: 2022-11-08 | Disposition: A | Discharge status: Home / Self Care | Payer: Medicaid Other | Attending: Internal Medicine | Admitting: Internal Medicine

## 2022-11-08 DIAGNOSIS — J029 Acute pharyngitis, unspecified: Secondary | ICD-10-CM | POA: Diagnosis present

## 2022-11-08 DIAGNOSIS — Z1152 Encounter for screening for COVID-19: Secondary | ICD-10-CM | POA: Diagnosis not present

## 2022-11-08 DIAGNOSIS — J069 Acute upper respiratory infection, unspecified: Secondary | ICD-10-CM | POA: Diagnosis not present

## 2022-11-08 LAB — RESP PANEL BY RT-PCR (FLU A&B, COVID) ARPGX2
Influenza A by PCR: POSITIVE — AB
Influenza B by PCR: NEGATIVE
SARS Coronavirus 2 by RT PCR: NEGATIVE

## 2022-11-08 LAB — POCT RAPID STREP A (OFFICE): Rapid Strep A Screen: NEGATIVE

## 2022-11-08 MED ORDER — CETIRIZINE HCL 10 MG PO TABS: 10.0000 mg | ORAL_TABLET | Freq: Every day | ORAL | 0 refills | Status: DC

## 2022-11-08 MED ORDER — ALBUTEROL SULFATE HFA 108 (90 BASE) MCG/ACT IN AERS: 1.0000 | INHALATION_SPRAY | Freq: Four times a day (QID) | RESPIRATORY_TRACT | 0 refills | Status: DC | PRN

## 2022-11-08 MED ORDER — FLUTICASONE PROPIONATE 50 MCG/ACT NA SUSP: 1.0000 | Freq: Every day | NASAL | 0 refills | Status: AC

## 2022-11-08 MED ORDER — ONDANSETRON 4 MG PO TBDP: 4.0000 mg | ORAL_TABLET | Freq: Three times a day (TID) | ORAL | 0 refills | Status: DC | PRN

## 2022-11-08 NOTE — ED Provider Notes (Signed)
[facility]    CSN: [id number] Arrival date & time: 11/08/22  1011      History   Chief Complaint Chief Complaint  Patient presents with   Nasal Congestion    uri    HPI Julie Banks is a 26 y.o. female.   Patient presents with headache, nausea without vomiting, fever, nasal congestion, cough, sore throat, right ear pain that started about 3 to 4 days ago.  Patient is taking Sudafed, Tylenol, Advil with minimal improvement.  Family member has similar symptoms.  Patient is not sure temp max at home but states that she felt feverish.  Denies shortness of breath, diarrhea, abdominal pain.  Patient denies history of asthma and does not smoke cigarettes.     Past Medical History:  Diagnosis Date   Anemia    Diabetes mellitus without complication [facility])     Patient Active Problem List   Diagnosis Date Noted   Other insomnia 03/16/2019   Anxiety and depression 03/16/2019   Menorrhagia with irregular cycle 03/16/2019   Diabetes mellitus type 2, uncontrolled, with complications 19/50/9326    History reviewed. No pertinent surgical history.  OB History     Gravida  0   Para  0   Term  0   Preterm  0   AB  0   Living  0      SAB  0   IAB  0   Ectopic  0   Multiple  0   Live Births               Home Medications    Prior to Admission medications   Medication Sig Start Date End Date Taking? Authorizing Provider  albuterol (VENTOLIN HFA) 108 (90 Base) MCG/ACT inhaler Inhale 1-2 puffs into the lungs every 6 (six) hours as needed for wheezing or shortness of breath. 11/08/22  Yes Lelend Heinecke, Michele Rockers, FNP  cetirizine (ZYRTEC) 10 MG tablet Take 1 tablet (10 mg total) by mouth daily. 11/08/22  Yes Chistina Roston, Hildred Alamin E, FNP  fluticasone (FLONASE) 50 MCG/ACT nasal spray Place 1 spray into both nostrils daily. 11/08/22  Yes Annaliesa Blann, Hildred Alamin E, FNP  ondansetron (ZOFRAN-ODT) 4 MG disintegrating tablet Take 1 tablet (4 mg total) by mouth every 8 (eight) hours as  needed for nausea or vomiting. 11/08/22  Yes Teodora Medici, FNP  amitriptyline (ELAVIL) 25 MG tablet Take 1 tablet (25 mg total) by mouth at bedtime as needed for sleep. Patient not taking: Reported on 10/13/2022 04/05/19 07/04/19  Gildardo Pounds, NP  amoxicillin (AMOXIL) 500 MG capsule Take 1 capsule (500 mg total) by mouth 3 (three) times daily. 10/13/22   Francene Finders, PA-C  atorvastatin (LIPITOR) 20 MG tablet Take 1 tablet (20 mg total) by mouth daily. Patient not taking: Reported on 10/13/2022 03/16/19   Gildardo Pounds, NP  Blood Glucose Monitoring Suppl (TRUE METRIX METER) w/Device KIT Check blood sugar fasting daily Patient not taking: Reported on 10/13/2022 02/02/19   Argentina Donovan, PA-C  cycloSPORINE (RESTASIS) 0.05 % ophthalmic emulsion Place 2 drops into both eyes daily.     [provider]  diphenhydrAMINE (BENADRYL) 25 MG tablet Take 1 tablet (25 mg total) by mouth every 6 (six) hours as needed. Patient not taking: Reported on 02/02/2019 01/21/19   Wurst, [place], PA-C  Diphenhydramine-APAP, sleep, (MIDOL PM PO) Take 2 tablets by mouth every 4 (four) hours as needed (for cramping). Patient not taking: Reported on 10/13/2022    [provider]  ferrous fumarate (HEMOCYTE - 106 MG FE) 325 (106 Fe) MG TABS tablet Take 1 tablet (106 mg of iron total) by mouth 2 (two) times daily. Patient not taking: Reported on 10/13/2022 02/05/19   Argentina Donovan, PA-C  glucose blood (TRUE METRIX BLOOD GLUCOSE TEST) test strip Use as instructed Patient not taking: Reported on 10/13/2022 02/02/19   Argentina Donovan, PA-C  levonorgestrel-ethinyl estradiol (ALESSE) 0.1-20 MG-MCG tablet Take 1 tablet by mouth daily. 04/05/19   Gildardo Pounds, NP  metFORMIN (GLUCOPHAGE) 500 MG tablet Take 1 tablet (500 mg total) by mouth 2 (two) times daily with a meal. Patient not taking: Reported on 10/13/2022 02/02/19   Argentina Donovan, PA-C  Mouthwash Compounding Base LIQD Swish and spit 15  mLs 3 (three) times daily as needed (sorethroat). Compounding formula: Lidocaine viscous 2% 60 mL, Maalox 15m, Benadryl 12.5 mg/582m60 mL Patient not taking: Reported on 10/13/2022 09/01/21   LaChase PicketMD  sucralfate (CARAFATE) 1 g tablet Take 1 tablet (1 g total) by mouth 4 (four) times daily -  with meals and at bedtime for 14 days. Patient not taking: Reported on 10/13/2022 08/12/21 08/26/21  MoLynden Oxfordcales, PA-C  traZODone (DESYREL) 50 MG tablet Take 1 tablet (50 mg total) by mouth at bedtime for 30 days. Patient not taking: Reported on 10/13/2022 03/15/19 04/14/19  FlGildardo PoundsNP  TRUEplus Lancets 28G MISC 1 each by Does not apply route daily. Patient not taking: Reported on 10/13/2022 02/02/19   McArgentina DonovanPA-C    Family History Family History  Problem Relation Age of Onset   Diabetes Mother    Diabetes Sister    Diabetes Maternal Uncle     Social History Social History   Tobacco Use   Smoking status: Never   Smokeless tobacco: Never  Vaping Use   Vaping Use: Never used  Substance Use Topics   Alcohol use: No   Drug use: No     Allergies   Patient has no known allergies.   Review of Systems Review of Systems Per HPI  Physical Exam Triage Vital Signs ED Triage Vitals  Enc Vitals Group     BP 11/08/22 1306 121/89     Pulse Rate 11/08/22 1306 94     Resp 11/08/22 1306 16     Temp 11/08/22 1306 98.2 F (36.8 C)     Temp Source 11/08/22 1306 Oral     SpO2 11/08/22 1306 98 %     Weight --      Height --      Head Circumference --      Peak Flow --      Pain Score 11/08/22 1304 8     Pain Loc --      Pain Edu? --      Excl[place] in [place]    No data found.  Updated Vital Signs BP 121/89   Pulse 94   Temp 98.2 F (36.8 C) (Oral)   Resp 16   LMP 11/06/2022 (Exact Date)   SpO2 98%   Visual Acuity Right Eye Distance:   Left Eye Distance:   Bilateral Distance:    Right Eye Near:   Left Eye Near:    Bilateral Near:      Physical Exam Constitutional:      General: She is not in acute distress.    Appearance: Normal appearance. She is not toxic-appearing or diaphoretic.  HENT:  Head: Normocephalic and atraumatic.     Right Ear: Tympanic membrane and ear canal normal.     Left Ear: Tympanic membrane and ear canal normal.     Nose: Congestion present.     Mouth/Throat:     Mouth: Mucous membranes are moist.     Pharynx: Posterior oropharyngeal erythema present.  Eyes:     Extraocular Movements: Extraocular movements intact.     Conjunctiva/sclera: Conjunctivae normal.     Pupils: Pupils are equal, round, and reactive to light.  Cardiovascular:     Rate and Rhythm: Normal rate and regular rhythm.     Pulses: Normal pulses.     Heart sounds: Normal heart sounds.  Pulmonary:     Effort: Pulmonary effort is normal. No respiratory distress.     Breath sounds: Normal breath sounds. No stridor. No wheezing, rhonchi or rales.  Abdominal:     General: Abdomen is flat. Bowel sounds are normal.     Palpations: Abdomen is soft.  Musculoskeletal:        General: Normal range of motion.     Cervical back: Normal range of motion.  Skin:    General: Skin is warm and dry.  Neurological:     General: No focal deficit present.     Mental Status: She is alert and oriented to person, place, and time. Mental status is at baseline.  Psychiatric:        Mood and Affect: Mood normal.        Behavior: Behavior normal.      UC Treatments / Results  Labs (all labs ordered are listed, but only abnormal results are displayed) Labs Reviewed  CULTURE, GROUP A STREP ([facility])  RESP PANEL BY RT-PCR (FLU A&B, COVID) ARPGX2  POCT RAPID STREP A (OFFICE)    EKG   Radiology No results found.  Procedures Procedures (including critical care time)  Medications Ordered in UC Medications - No data to display  Initial Impression / Assessment and Plan / UC Course  I have reviewed the triage vital signs and the  nursing notes.  Pertinent labs & imaging results that were available during my care of the patient were reviewed by me and considered in my medical decision making (see chart for details).     Patient presents with symptoms likely from a viral upper respiratory infection. Differential includes bacterial pneumonia, sinusitis, allergic rhinitis, covid 19, flu, RSV. Do not suspect underlying cardiopulmonary process. Symptoms seem unlikely related to ACS, CHF or COPD exacerbations, pneumonia, pneumothorax. Patient is nontoxic appearing and not in need of emergent medical intervention.  Strep is negative.  Throat culture, COVID-19, flu test pending.  Recommended symptom control with medications and supportive care.  Patient sent prescriptions.  Advised adequate fluid hydration and bland diet.  These medications should be safe as patient denies that she takes any daily medications.  Return if symptoms fail to improve. Patient states understanding and is agreeable.  Discharged with PCP followup.  Final Clinical Impressions(s) / UC Diagnoses   Final diagnoses:  Viral upper respiratory tract infection with cough  Sore throat     Discharge Instructions      Strep was negative.  Throat culture, COVID-19, flu test pending.  Will call if it is positive. I have sent you several medications to help alleviate your symptoms as it appears that you have a viral upper respiratory infection.  You may follow-up if symptoms persist or worsen.     ED Prescriptions  Medication Sig Dispense Auth. Provider   ondansetron (ZOFRAN-ODT) 4 MG disintegrating tablet Take 1 tablet (4 mg total) by mouth every 8 (eight) hours as needed for nausea or vomiting. 20 tablet [place], [place] E, [place]   fluticasone [facility]) 50 MCG/ACT nasal spray Place 1 spray into both nostrils daily. 16 g Oswaldo Conroy E, [place]   cetirizine (ZYRTEC) 10 MG tablet Take 1 tablet (10 mg total) by mouth daily. 30 tablet [place], [place] E, [place]    albuterol (VENTOLIN HFA) 108 (90 Base) MCG/ACT inhaler Inhale 1-2 puffs into the lungs every 6 (six) hours as needed for wheezing or shortness of breath. 1 each Teodora Medici, [place]      PDMP not reviewed this encounter.   Teodora Medici, [place] 11/08/22 1433

## 2022-11-08 NOTE — ED Triage Notes (Signed)
Pt presents to uc with co of ha, nausea, fever, congestion sine Thursday with new onset of otalgia on right side. Pt has been taking sudafed tylenol and advill.

## 2022-11-08 NOTE — Discharge Instructions (Signed)
Strep was negative.  Throat culture, COVID-19, flu test pending.  Will call if it is positive. I have sent you several medications to help alleviate your symptoms as it appears that you have a viral upper respiratory infection.  You may follow-up if symptoms persist or worsen.

## 2022-11-11 LAB — CULTURE, GROUP A STREP (THRC)

## 2022-11-21 ENCOUNTER — Telehealth: Payer: Medicaid Other | Admitting: Nurse Practitioner

## 2022-11-21 DIAGNOSIS — B3731 Acute candidiasis of vulva and vagina: Secondary | ICD-10-CM | POA: Diagnosis not present

## 2022-11-21 MED ORDER — FLUCONAZOLE 150 MG PO TABS: 150.0000 mg | ORAL_TABLET | Freq: Once | ORAL | 0 refills | Status: AC

## 2022-11-21 NOTE — Progress Notes (Signed)
I have spent 5 minutes in review of e-visit questionnaire, review and updating patient chart, medical decision making and response to patient.  ° °Kista Robb W Mikalah Skyles, NP ° °  °

## 2022-11-21 NOTE — Progress Notes (Signed)

## 2023-01-07 ENCOUNTER — Telehealth: Payer: Medicaid Other | Admitting: Physician Assistant

## 2023-01-07 DIAGNOSIS — R6889 Other general symptoms and signs: Secondary | ICD-10-CM

## 2023-01-08 MED ORDER — OSELTAMIVIR PHOSPHATE 75 MG PO CAPS: 75.0000 mg | ORAL_CAPSULE | Freq: Two times a day (BID) | ORAL | 0 refills | Status: DC

## 2023-01-08 NOTE — Progress Notes (Signed)

## 2023-01-08 NOTE — Progress Notes (Signed)
Message sent to patient requesting further input regarding current symptoms. Awaiting patient response.  

## 2023-01-08 NOTE — Addendum Note (Signed)
Addended by: Brunetta Jeans on: 01/08/2023 01:58 PM   Modules accepted: Orders

## 2023-01-08 NOTE — Progress Notes (Signed)
I have spent 5 minutes in review of e-visit questionnaire, review and updating patient chart, medical decision making and response to patient.   Trooper Olander Cody Kamiyah Kindel, PA-C    

## 2023-01-15 ENCOUNTER — Telehealth: Payer: Medicaid Other | Admitting: Physician Assistant

## 2023-01-15 DIAGNOSIS — J069 Acute upper respiratory infection, unspecified: Secondary | ICD-10-CM

## 2023-01-15 DIAGNOSIS — R079 Chest pain, unspecified: Secondary | ICD-10-CM

## 2023-01-15 NOTE — Progress Notes (Signed)
Because of increased work of breathing and associated chest pain, and concern for possible pneumonia, I feel your condition warrants further evaluation and I recommend that you be seen in a face to face visit.   NOTE: There will be NO CHARGE for this eVisit   If you are having a true medical emergency please call 911.      For an urgent face to face visit, [facility] has eight urgent care centers for your convenience:   NEW!! [facility] [place] [facility] [place] Get Driving Directions [phone] [address] [place], [place] [facility] [place] [facility] [place] Get Driving Directions [id number] [place], [place] [address]   [facility] [place] [facility]) Get Driving Directions [id number] [address], [place] [address]  [facility] [place] ([place]) Get Driving Directions [id number] [address] [place],  [place]  [address]  [place] [facility] [place] [facility] - at [address] Get Driving Directions  [phone] [address] [place],  [place] [address]   [facility] Get Driving Directions [id number] [address] [place], [place], [place] [address]   [facility] Get Driving Directions  [id number] [address] [place], [place] [address]   [facility] [place] Get Driving Directions [id number] [address], [place], [place] [address]  Your MyChart E-visit questionnaire answers were reviewed by a board certified advanced clinical practitioner to complete your personal care plan based on your specific symptoms.  Thank you for using e-Visits.

## 2023-01-15 NOTE — Progress Notes (Signed)
Message sent to patient requesting further input regarding current symptoms. Awaiting patient response.  

## 2023-02-14 ENCOUNTER — Telehealth: Payer: Medicaid Other | Admitting: Family

## 2023-02-14 DIAGNOSIS — H109 Unspecified conjunctivitis: Secondary | ICD-10-CM

## 2023-02-14 MED ORDER — POLYMYXIN B-TRIMETHOPRIM 10000-0.1 UNIT/ML-% OP SOLN: 1.0000 [drp] | Freq: Four times a day (QID) | OPHTHALMIC | 0 refills | Status: DC

## 2023-02-14 NOTE — Progress Notes (Signed)

## 2023-07-27 ENCOUNTER — Other Ambulatory Visit: Payer: Self-pay

## 2023-07-27 ENCOUNTER — Ambulatory Visit: Payer: Medicaid Other | Attending: Nurse Practitioner | Admitting: Nurse Practitioner

## 2023-07-27 ENCOUNTER — Other Ambulatory Visit: Payer: Self-pay | Admitting: Pharmacist

## 2023-07-27 ENCOUNTER — Encounter: Payer: Self-pay | Admitting: Nurse Practitioner

## 2023-07-27 VITALS — BP 119/87 | HR 80 | Ht 63.0 in | Wt 327.2 lb

## 2023-07-27 DIAGNOSIS — E669 Obesity, unspecified: Secondary | ICD-10-CM

## 2023-07-27 DIAGNOSIS — Z7689 Persons encountering health services in other specified circumstances: Secondary | ICD-10-CM

## 2023-07-27 DIAGNOSIS — L659 Nonscarring hair loss, unspecified: Secondary | ICD-10-CM

## 2023-07-27 DIAGNOSIS — R7989 Other specified abnormal findings of blood chemistry: Secondary | ICD-10-CM

## 2023-07-27 DIAGNOSIS — Z124 Encounter for screening for malignant neoplasm of cervix: Secondary | ICD-10-CM

## 2023-07-27 DIAGNOSIS — E785 Hyperlipidemia, unspecified: Secondary | ICD-10-CM

## 2023-07-27 DIAGNOSIS — F32A Depression, unspecified: Secondary | ICD-10-CM

## 2023-07-27 DIAGNOSIS — F419 Anxiety disorder, unspecified: Secondary | ICD-10-CM

## 2023-07-27 DIAGNOSIS — E1165 Type 2 diabetes mellitus with hyperglycemia: Secondary | ICD-10-CM | POA: Diagnosis not present

## 2023-07-27 DIAGNOSIS — J984 Other disorders of lung: Secondary | ICD-10-CM

## 2023-07-27 DIAGNOSIS — Z7985 Long-term (current) use of injectable non-insulin antidiabetic drugs: Secondary | ICD-10-CM

## 2023-07-27 DIAGNOSIS — Z7984 Long term (current) use of oral hypoglycemic drugs: Secondary | ICD-10-CM

## 2023-07-27 DIAGNOSIS — Z6841 Body Mass Index (BMI) 40.0 and over, adult: Secondary | ICD-10-CM

## 2023-07-27 MED ORDER — BUSPIRONE HCL 10 MG PO TABS
5.0000 mg | ORAL_TABLET | Freq: Two times a day (BID) | ORAL | 3 refills | Status: AC
  Filled 2023-07-27: qty 60, 30d supply, fill #0
  Filled 2023-08-23: qty 60, 30d supply, fill #1
  Filled 2023-10-04: qty 60, 30d supply, fill #2
  Filled 2023-11-01 – 2023-12-18 (×2): qty 60, 30d supply, fill #3

## 2023-07-27 MED ORDER — METFORMIN HCL ER 500 MG PO TB24
500.0000 mg | ORAL_TABLET | Freq: Two times a day (BID) | ORAL | 1 refills | Status: DC
  Filled 2023-07-27: qty 180, 90d supply, fill #0

## 2023-07-27 MED ORDER — ACCU-CHEK GUIDE VI STRP
ORAL_STRIP | 12 refills | Status: AC
Start: 2023-07-27 — End: ?
  Filled 2023-07-27: qty 100, 34d supply, fill #0
  Filled 2023-10-04: qty 100, 34d supply, fill #1

## 2023-07-27 MED ORDER — ALBUTEROL SULFATE HFA 108 (90 BASE) MCG/ACT IN AERS
1.0000 | INHALATION_SPRAY | Freq: Four times a day (QID) | RESPIRATORY_TRACT | 0 refills | Status: AC | PRN
  Filled 2023-07-27: qty 18, 25d supply, fill #0

## 2023-07-27 MED ORDER — OZEMPIC (0.25 OR 0.5 MG/DOSE) 2 MG/3ML ~~LOC~~ SOPN
0.2500 mg | PEN_INJECTOR | SUBCUTANEOUS | 1 refills | Status: DC
  Filled 2023-07-27: qty 3, 56d supply, fill #0

## 2023-07-27 MED ORDER — ATORVASTATIN CALCIUM 20 MG PO TABS
20.0000 mg | ORAL_TABLET | Freq: Every day | ORAL | 3 refills | Status: AC
  Filled 2023-07-27: qty 90, 90d supply, fill #0
  Filled 2023-11-01 – 2023-12-18 (×2): qty 90, 90d supply, fill #1
  Filled 2024-02-04: qty 90, 90d supply, fill #2

## 2023-07-27 MED ORDER — ACCU-CHEK GUIDE ME W/DEVICE KIT
1.0000 | PACK | 0 refills | Status: AC
Start: 2023-07-27 — End: ?
  Filled 2023-07-27: qty 1, 30d supply, fill #0

## 2023-07-27 MED ORDER — ACCU-CHEK SOFTCLIX LANCETS MISC
3 refills | Status: AC
  Filled 2023-07-27: qty 100, 34d supply, fill #0
  Filled 2023-10-04: qty 100, 34d supply, fill #1
  Filled 2023-12-18: qty 100, 34d supply, fill #2

## 2023-07-27 MED ORDER — OZEMPIC (0.25 OR 0.5 MG/DOSE) 2 MG/3ML ~~LOC~~ SOPN
0.2500 mg | PEN_INJECTOR | SUBCUTANEOUS | 1 refills | Status: DC
  Filled 2023-07-27: qty 3, 56d supply, fill #0
  Filled 2023-10-04: qty 3, 28d supply, fill #0

## 2023-07-27 NOTE — Progress Notes (Signed)
Assessment & Plan:  Julie Banks was seen today for anxiety.  Diagnoses and all orders for this visit:  Encounter to establish care  Type 2 diabetes mellitus with hyperglycemia, without long-term current use of insulin (HCC) -     Hemoglobin A1c -     CMP14+EGFR -     Semaglutide,0.25 or 0.5MG /DOS, (OZEMPIC, 0.25 OR 0.5 MG/DOSE,) 2 MG/3ML SOPN; Inject 0.25 mg into the skin once a week. -     Accu-Chek Softclix Lancets lancets; Use as instructed. Inject into the skin twice daily -     Blood Glucose Monitoring Suppl (ACCU-CHEK GUIDE ME) w/Device KIT; 1 each by Does not apply route once for 1 dose. Use as instructed. Check blood glucose level by fingerstick 2-3 times per day. -     glucose blood (ACCU-CHEK GUIDE) test strip; Use as instructed. Check blood glucose by fingerstick twice per day. -     metFORMIN (GLUCOPHAGE-XR) 500 MG 24 hr tablet; Take 1 tablet (500 mg total) by mouth 2 (two) times daily with a meal. -     Microalbumin / creatinine urine ratio -     Ambulatory referral to Ophthalmology  Abnormal CBC -     CBC with Differential  Dyslipidemia, goal LDL below 70 -     atorvastatin (LIPITOR) 20 MG tablet; Take 1 tablet (20 mg total) by mouth daily.  Hair thinning -     Thyroid Panel With TSH  Anxiety and depression -     busPIRone (BUSPAR) 10 MG tablet; Take 0.5-1 tablets (5-10 mg total) by mouth 2 (two) times daily.  Restrictive airway disease -     albuterol (VENTOLIN HFA) 108 (90 Base) MCG/ACT inhaler; Inhale 1-2 puffs into the lungs every 6 (six) hours as needed for wheezing or shortness of breath.  Encounter for Papanicolaou smear for cervical cancer screening -     Ambulatory referral to Gynecology    Patient has been counseled on age-appropriate routine health concerns for screening and prevention. These are reviewed and up-to-date. Referrals have been placed accordingly. Immunizations are up-to-date or declined.    Subjective:   Chief Complaint  Patient presents  with   Anxiety   HPI Julie Banks 27 y.o. female presents to office today to establish care. I have not seen her in this office in over 4 years. She has not been on any medications since her last office visit.    DM 2 Blood sugars at home running 250-280s She states the metformin she was previously taking caused nausea. BMI 57.98.  She states she recently has started making dietary modifications and trying to exercise more. Lab Results  Component Value Date   HGBA1C 7.1 (A) 02/02/2019     Anxiety and Depression She was previously prescribed elavil and trazodone but reports both of these made her drowsy throughout the day.  She states her panic attacks are increasing in frequency and aggravating factors include being out in public.  Associated symptoms include hives, sweating, shortness of breath and lightheadedness. She used to see a therapist around the age of 72 but not currently.      Review of Systems  Constitutional:  Negative for fever, malaise/fatigue and weight loss.       Hair thinning  HENT: Negative.  Negative for nosebleeds.   Eyes: Negative.  Negative for blurred vision, double vision and photophobia.  Respiratory: Negative.  Negative for cough and shortness of breath.   Cardiovascular: Negative.  Negative for chest pain, palpitations and  leg swelling.  Gastrointestinal: Negative.  Negative for heartburn, nausea and vomiting.  Musculoskeletal: Negative.  Negative for myalgias.  Neurological: Negative.  Negative for dizziness, focal weakness, seizures and headaches.  Psychiatric/Behavioral: Negative.  Negative for suicidal ideas.     Past Medical History:  Diagnosis Date   Anemia    Diabetes mellitus without complication (HCC)     History reviewed. No pertinent surgical history.  Family History  Problem Relation Age of Onset   Diabetes Mother    Diabetes Sister    Diabetes Maternal Uncle     Social History Reviewed with no changes to be made today.    Outpatient Medications Prior to Visit  Medication Sig Dispense Refill   fluticasone (FLONASE) 50 MCG/ACT nasal spray Place 1 spray into both nostrils daily. 16 g 0   traZODone (DESYREL) 50 MG tablet Take 1 tablet (50 mg total) by mouth at bedtime for 30 days. (Patient not taking: Reported on 10/13/2022) 30 tablet 3   albuterol (VENTOLIN HFA) 108 (90 Base) MCG/ACT inhaler Inhale 1-2 puffs into the lungs every 6 (six) hours as needed for wheezing or shortness of breath. 1 each 0   amitriptyline (ELAVIL) 25 MG tablet Take 1 tablet (25 mg total) by mouth at bedtime as needed for sleep. (Patient not taking: Reported on 10/13/2022) 90 tablet 0   amoxicillin (AMOXIL) 500 MG capsule Take 1 capsule (500 mg total) by mouth 3 (three) times daily. 21 capsule 0   atorvastatin (LIPITOR) 20 MG tablet Take 1 tablet (20 mg total) by mouth daily. (Patient not taking: Reported on 10/13/2022) 90 tablet 3   Blood Glucose Monitoring Suppl (TRUE METRIX METER) w/Device KIT Check blood sugar fasting daily (Patient not taking: Reported on 10/13/2022) 1 kit 0   cetirizine (ZYRTEC) 10 MG tablet Take 1 tablet (10 mg total) by mouth daily. 30 tablet 0   cycloSPORINE (RESTASIS) 0.05 % ophthalmic emulsion Place 2 drops into both eyes daily.      diphenhydrAMINE (BENADRYL) 25 MG tablet Take 1 tablet (25 mg total) by mouth every 6 (six) hours as needed. (Patient not taking: Reported on 02/02/2019) 30 tablet 0   Diphenhydramine-APAP, sleep, (MIDOL PM PO) Take 2 tablets by mouth every 4 (four) hours as needed (for cramping). (Patient not taking: Reported on 10/13/2022)     ferrous fumarate (HEMOCYTE - 106 MG FE) 325 (106 Fe) MG TABS tablet Take 1 tablet (106 mg of iron total) by mouth 2 (two) times daily. (Patient not taking: Reported on 10/13/2022) 100 each 1   glucose blood (TRUE METRIX BLOOD GLUCOSE TEST) test strip Use as instructed (Patient not taking: Reported on 10/13/2022) 100 each 12   levonorgestrel-ethinyl estradiol  (ALESSE) 0.1-20 MG-MCG tablet Take 1 tablet by mouth daily. 3 Package 3   metFORMIN (GLUCOPHAGE) 500 MG tablet Take 1 tablet (500 mg total) by mouth 2 (two) times daily with a meal. (Patient not taking: Reported on 10/13/2022) 180 tablet 3   Mouthwash Compounding Base LIQD Swish and spit 15 mLs 3 (three) times daily as needed (sorethroat). Compounding formula: Lidocaine viscous 2% 60 mL, Maalox 60ml, Benadryl 12.5 mg/81ml 60 mL (Patient not taking: Reported on 10/13/2022) 180 mL 0   ondansetron (ZOFRAN-ODT) 4 MG disintegrating tablet Take 1 tablet (4 mg total) by mouth every 8 (eight) hours as needed for nausea or vomiting. 20 tablet 0   oseltamivir (TAMIFLU) 75 MG capsule Take 1 capsule (75 mg total) by mouth 2 (two) times daily. 10 capsule 0  sucralfate (CARAFATE) 1 g tablet Take 1 tablet (1 g total) by mouth 4 (four) times daily -  with meals and at bedtime for 14 days. (Patient not taking: Reported on 10/13/2022) 56 tablet 0   trimethoprim-polymyxin b (POLYTRIM) ophthalmic solution Place 1 drop into the left eye every 6 (six) hours. 10 mL 0   TRUEplus Lancets 28G MISC 1 each by Does not apply route daily. (Patient not taking: Reported on 10/13/2022) 100 each 1   No facility-administered medications prior to visit.    No Known Allergies     Objective:    BP 119/87 (BP Location: Left Arm, Patient Position: Sitting, Cuff Size: Normal)   Pulse 80   Ht 5\' 3"  (1.6 m)   Wt (!) 327 lb 3.2 oz (148.4 kg)   LMP 07/22/2023   SpO2 98%   BMI 57.96 kg/m  Wt Readings from Last 3 Encounters:  07/27/23 (!) 327 lb 3.2 oz (148.4 kg)  02/02/19 (!) 400 lb (181.4 kg)  01/21/19 (!) 363 lb 1.6 oz (164.7 kg)    Physical Exam Vitals and nursing note reviewed.  Constitutional:      Appearance: She is well-developed.  HENT:     Head: Normocephalic and atraumatic.  Cardiovascular:     Rate and Rhythm: Normal rate and regular rhythm.     Heart sounds: Normal heart sounds. No murmur heard.    No friction  rub. No gallop.  Pulmonary:     Effort: Pulmonary effort is normal. No tachypnea or respiratory distress.     Breath sounds: Normal breath sounds. No decreased breath sounds, wheezing, rhonchi or rales.  Chest:     Chest wall: No tenderness.  Abdominal:     General: Bowel sounds are normal.     Palpations: Abdomen is soft.  Musculoskeletal:        General: Normal range of motion.     Cervical back: Normal range of motion.  Skin:    General: Skin is warm and dry.  Neurological:     Mental Status: She is alert and oriented to person, place, and time.     Coordination: Coordination normal.  Psychiatric:        Behavior: Behavior normal. Behavior is cooperative.        Thought Content: Thought content normal.        Judgment: Judgment normal.          Patient has been counseled extensively about nutrition and exercise as well as the importance of adherence with medications and regular follow-up. The patient was given clear instructions to go to ER or return to medical center if symptoms don't improve, worsen or new problems develop. The patient verbalized understanding.   Follow-up: Return in about 3 months (around 10/26/2023).   Claiborne Rigg, FNP-BC [facility] [place], [place] [phone]   07/27/2023, 2:17 PM

## 2023-07-28 ENCOUNTER — Other Ambulatory Visit: Payer: Self-pay

## 2023-07-28 LAB — MICROALBUMIN / CREATININE URINE RATIO
Creatinine, Urine: 84.7 mg/dL
Microalb/Creat Ratio: 26 mg/g{creat} (ref 0–29)
Microalbumin, Urine: 22 ug/mL

## 2023-07-28 LAB — CMP14+EGFR
ALT: 88 IU/L — ABNORMAL HIGH (ref 0–32)
AST: 65 IU/L — ABNORMAL HIGH (ref 0–40)
Albumin: 4 g/dL (ref 4.0–5.0)
Alkaline Phosphatase: 150 IU/L — ABNORMAL HIGH (ref 44–121)
BUN/Creatinine Ratio: 23 (ref 9–23)
BUN: 12 mg/dL (ref 6–20)
Bilirubin Total: 0.4 mg/dL (ref 0.0–1.2)
CO2: 25 mmol/L (ref 20–29)
Calcium: 10 mg/dL (ref 8.7–10.2)
Chloride: 98 mmol/L (ref 96–106)
Creatinine, Ser: 0.52 mg/dL — ABNORMAL LOW (ref 0.57–1.00)
Globulin, Total: 3.7 g/dL (ref 1.5–4.5)
Glucose: 255 mg/dL — ABNORMAL HIGH (ref 70–99)
Potassium: 5 mmol/L (ref 3.5–5.2)
Sodium: 137 mmol/L (ref 134–144)
Total Protein: 7.7 g/dL (ref 6.0–8.5)
eGFR: 129 mL/min/{1.73_m2} (ref >59)

## 2023-07-28 LAB — CBC WITH DIFFERENTIAL/PLATELET
Basophils Absolute: 0.1 10*3/uL (ref 0.0–0.2)
Basos: 1 %
EOS (ABSOLUTE): 0.2 10*3/uL (ref 0.0–0.4)
Eos: 2 %
Hematocrit: 47.3 % — ABNORMAL HIGH (ref 34.0–46.6)
Hemoglobin: 14.9 g/dL (ref 11.1–15.9)
Immature Grans (Abs): 0.1 10*3/uL (ref 0.0–0.1)
Immature Granulocytes: 1 %
Lymphocytes Absolute: 3.7 10*3/uL — ABNORMAL HIGH (ref 0.7–3.1)
Lymphs: 34 %
MCH: 28.2 pg (ref 26.6–33.0)
MCHC: 31.5 g/dL (ref 31.5–35.7)
MCV: 90 fL (ref 79–97)
Monocytes Absolute: 0.5 10*3/uL (ref 0.1–0.9)
Monocytes: 5 %
Neutrophils Absolute: 6.3 10*3/uL (ref 1.4–7.0)
Neutrophils: 57 %
Platelets: 404 10*3/uL (ref 150–450)
RBC: 5.28 x10E6/uL (ref 3.77–5.28)
RDW: 11.9 % (ref 11.7–15.4)
WBC: 10.8 10*3/uL (ref 3.4–10.8)

## 2023-07-28 LAB — HEMOGLOBIN A1C
Est. average glucose Bld gHb Est-mCnc: 301 mg/dL
Hgb A1c MFr Bld: 12.1 % — ABNORMAL HIGH (ref 4.8–5.6)

## 2023-07-28 LAB — THYROID PANEL WITH TSH
Free Thyroxine Index: 2.5 (ref 1.2–4.9)
T3 Uptake Ratio: 26 % (ref 24–39)
T4, Total: 9.6 ug/dL (ref 4.5–12.0)
TSH: 2.74 u[IU]/mL (ref 0.450–4.500)

## 2023-08-01 ENCOUNTER — Other Ambulatory Visit: Payer: Self-pay | Admitting: Nurse Practitioner

## 2023-08-01 DIAGNOSIS — E1165 Type 2 diabetes mellitus with hyperglycemia: Secondary | ICD-10-CM

## 2023-08-01 DIAGNOSIS — R748 Abnormal levels of other serum enzymes: Secondary | ICD-10-CM

## 2023-08-01 MED ORDER — LANTUS SOLOSTAR 100 UNIT/ML ~~LOC~~ SOPN
5.0000 [IU] | PEN_INJECTOR | Freq: Every day | SUBCUTANEOUS | 99 refills | Status: DC
Start: 2023-08-01 — End: 2023-10-05
  Filled 2023-08-01: qty 3, 28d supply, fill #0
  Filled 2023-10-04: qty 3, 28d supply, fill #1

## 2023-08-02 ENCOUNTER — Other Ambulatory Visit: Payer: Self-pay | Admitting: Nurse Practitioner

## 2023-08-02 ENCOUNTER — Other Ambulatory Visit: Payer: Self-pay

## 2023-08-02 ENCOUNTER — Encounter: Payer: Self-pay | Admitting: Nurse Practitioner

## 2023-08-10 ENCOUNTER — Other Ambulatory Visit (HOSPITAL_COMMUNITY): Payer: Self-pay

## 2023-08-23 ENCOUNTER — Telehealth: Payer: Self-pay | Admitting: Nurse Practitioner

## 2023-08-23 NOTE — Telephone Encounter (Signed)
Pt is calling to schedule appt with [facility].  Pt would also like to know from Zelda is it itme to increase her 2 MG/3ML SOPN [[id number]] Rx #: [id number] Semaglutide,0.25 or 0.5MG /DOS, (OZEMPIC, 0.25 OR 0.5 MG/DOSE,) 2 MG/3ML SOPN [[id number]]    CB- [phone]

## 2023-08-23 NOTE — Telephone Encounter (Signed)
Patient scheduled with Franky Macho

## 2023-09-01 ENCOUNTER — Other Ambulatory Visit: Payer: Self-pay

## 2023-09-06 ENCOUNTER — Ambulatory Visit: Payer: Medicaid Other | Admitting: Pharmacist

## 2023-09-06 NOTE — Progress Notes (Deleted)
S:     No chief complaint on file.  27 y.o. female who presents for diabetes evaluation, education, and management. Patient arrives in *** good spirits and presents without *** any assistance. ***Patient is accompanied by ***.   Patient was referred and last seen by Primary Care Provider, Bertram Denver, to establish care on 07/27/23.   PMH is significant for T2DM, anxiety, and depression.  At last visit, A1c was 12.1%. Patient reported home BG 250-280s and metformin and Ozempic were started.  Patient reports Diabetes was diagnosed in ***.   Today ***  Family/Social History:  No tobacco use No alcohol   Current diabetes medications include: metformin XR 500 mg twice daily, Ozempic 0.25 mg weekly Current hypertension medications include: none Current hyperlipidemia medications include: atorvastatin 20 mg daily  Patient reports adherence to taking all medications as prescribed.  *** Patient denies adherence with medications, reports missing *** medications *** times per week, on average.  Do you feel that your medications are working for you? {YES NO:22349} Have you been experiencing any side effects to the medications prescribed? {YES NO:22349} Do you have any problems obtaining medications due to transportation or finances? {YES [id number] Insurance coverage: ***  Patient {Actions; denies-reports:120008} hypoglycemic events.  Reported home fasting blood sugars: ***  Reported 2 hour post-meal/random blood sugars: ***.  Patient {Actions; denies-reports:120008} nocturia (nighttime urination).  Patient {Actions; denies-reports:120008} neuropathy (nerve pain). Patient {Actions; denies-reports:120008} visual changes. Patient {Actions; denies-reports:120008} self foot exams.   Patient reported dietary habits: Eats *** meals/day Breakfast: *** Lunch: *** Dinner: *** Snacks: *** Drinks: ***  Within the past 12 months, did you worry whether your food would run out before you  got money to buy more? {YES NO:22349} Within the past 12 months, did the food you bought run out, and you didn't have money to get more? {YES NO:22349} PHQ-9 Score: ***  Patient-reported exercise habits: ***   O:   ROS  Physical Exam  7 day average blood glucose: ***  Libre3 *** CGM Download today *** on *** % Time CGM is active: ***% Average Glucose: *** mg/dL Glucose Management Indicator: ***  Glucose Variability: ***% (goal <36%) Time in Goal:  - Time in range 70-180: ***% - Time above range: ***% - Time below range: ***% Observed patterns:   Lab Results  Component Value Date   HGBA1C 12.1 (H) 07/27/2023   There were no vitals filed for this visit.  Lipid Panel  No results found for: "CHOL", "TRIG", "HDL", "CHOLHDL", "VLDL", "LDLCALC", "LDLDIRECT"  Clinical Atherosclerotic Cardiovascular Disease (ASCVD): {YES/NO:21197} The ASCVD Risk score (Arnett DK, et al., 2019) failed to calculate for the following reasons:   The 2019 ASCVD risk score is only valid for ages 8 to 6   Patient is participating in a Managed Medicaid Plan:  {MM YES/NO:27447::"Yes"}   A/P: Diabetes longstanding *** currently ***. Patient is *** able to verbalize appropriate hypoglycemia management plan. Medication adherence appears ***. Control is suboptimal due to ***. -{Meds adjust:18428} basal insulin *** Lantus/Basaglar/Semglee (insulin glargine) *** Tresiba (insulin degludec) from *** units to *** units daily in the morning. Patient will continue to titrate 1 unit every *** days if fasting blood sugar > 100mg /dl until fasting blood sugars reach goal or next visit.  -{Meds adjust:18428} rapid insulin *** Novolog (insulin aspart) *** Humalog (insulin lispro) from *** to ***.  -{Meds adjust:18428} GLP-1 *** Trulicity (dulaglutide) *** Ozempic (semaglutide) *** Mounjaro (tirzepatide) from *** mg to *** mg .  -{Meds adjust:18428}  SGLT2-I *** Farxiga (dapagliflozin) *** Jardiance (empagliflozin) 10  mg. Counseled on sick day rules. -{Meds adjust:18428} metformin ***.  -Patient educated on purpose, proper use, and potential adverse effects of ***.  -Extensively discussed pathophysiology of diabetes, recommended lifestyle interventions, dietary effects on blood sugar control.  -Counseled on s/sx of and management of hypoglycemia.  -Next A1c anticipated ***.   ASCVD risk - primary ***secondary prevention in patient with diabetes. Last LDL is *** not at goal of <91 *** mg/dL. ASCVD risk factors include *** and 10-year ASCVD risk score of ***. {Desc; low/moderate/high:110033} intensity statin indicated.  -{Meds adjust:18428} ***statin *** mg.   Hypertension longstanding *** currently ***. Blood pressure goal of <130/80 *** mmHg. Medication adherence ***. Blood pressure control is suboptimal due to ***. -{Meds adjust:18428} *** mg.  Written patient instructions provided. Patient verbalized understanding of treatment plan.  Total time in face to face counseling *** minutes.    Follow-up:  Pharmacist *** PCP clinic visit in *** Patient seen with ***

## 2023-10-04 ENCOUNTER — Other Ambulatory Visit: Payer: Self-pay

## 2023-10-04 NOTE — Progress Notes (Unsigned)
S:     No chief complaint on file.  27 y.o. female who presents for diabetes evaluation, education, and management. PMH is significant for T2DM, Obesity (BMI: 57.98), anxiety and depression. Patient was referred and last seen by Primary Care Provider, Bertram Denver, on 07/27/2023. At that visit patient established care after 4 years without. Patient's A1c was 12.1% and UACR was 26 mg/g. Insulin glargine (Lantus Solostar) was initiated, Semaglutide (Ozempic) and metformin XR was initiated. Patient was given diabetic testing supplies   Patient arrives in good spirits and presents without any assistance. Patient endorses symptoms of cold sweats, nausea, dizziness and increased thirst. Patient does mention the nausea, dizziness, and thirst was present before the diabetes medication was started in September. Patient mentions the cold sweats were also present before the diabetes medications, however, they are more frequent now. Patient denies abdominal pain. Patient admits to not using the insulin because she does not like injections. Patient mentions she is taking ozempic 0.25 mg weekly and metformin XR 500 mg BID (2 tabs in the AM and 2 tabs in the PM). Patient endorses interest in a CGM today.   Family/Social History:  Diabetes (mother/sister/maternal uncle)  Current diabetes medications include: Metformin XR 500 mg BID, Ozempic 0.5 weekly, Lantus Solostar 5 units at bedtime (not taking) Current hyperlipidemia medications include: Atorvastatin 20 mg daily  Patient denies adherence to taking Lantus. Is taking metformin and Ozempica as prescribed.   Insurance coverage: [employer] Healthy Blue   Patient denies hypoglycemic events, but does endorse symptoms of cold sweats, dizziness, nausea but mentions these symptoms were present before the diabetes medications.  Reported home fasting blood sugars: 205s Reported 2 hour post-meal/random blood sugars:  - 325-360 (before ozempic was started)  -  225-230  (after ozempic was started)  Patient denies nocturia (nighttime urination).  Patient denies neuropathy (nerve pain). Patient denies visual changes. Patient  self foot exams. - not discussed today  Patient reported dietary habits: Eats 1-2 meals/day - Admits to dietary indiscretion - Mentions appetite suppression with ozempic  Lunch: burger, bagel, chicken, rice, bean Dinner: similar to lunch Snacks: unsalted nuts Drinks: water, vitamin water, Gatorade   Patient-reported exercise habits: walking   O:   Patient was diagnosed with Diabetes in 2020.   Lab Results  Component Value Date   HGBA1C 12.1 (H) 07/27/2023   There were no vitals filed for this visit.  Lipid Panel  No results found for: "CHOL", "TRIG", "HDL", "CHOLHDL", "VLDL", "LDLCALC", "LDLDIRECT"  Clinical Atherosclerotic Cardiovascular Disease (ASCVD): No  The ASCVD Risk score (Arnett DK, et al., 2019) failed to calculate for the following reasons:   The 2019 ASCVD risk score is only valid for ages 68 to 26   Patient is participating in [facility] Medicaid Healthy Blue   A/P: Diabetes longstanding currently uncontrolled. Patient is able to verbalize appropriate hypoglycemia management plan. Medication adherence appears optimal. Control is suboptimal due to medications not fully optimized. -Discontinued basal insulin Lantus (insulin glargine) due to patient not taking. -Increased dose of GLP-1 Ozempic (semaglutide) from 0.25 mg to 0.5 mg weekly .  -Increased dose of Metformin XR to 1000 mg BID.  -Prescription for Dexcom was sent to pharmacy -Lipid panel, Hepatic function panel and CMP14+EGFR today -Patient educated on purpose, proper use, and potential adverse effects of medications.  -Extensively discussed pathophysiology of diabetes, recommended lifestyle interventions, dietary effects on blood sugar control.  -Counseled on s/sx of and management of hypoglycemia.  -Next A1c anticipated 10/2023.  ASCVD risk -  primary prevention in patient with diabetes. Last LDL is not available. -Continued Atorvastatin 20 mg.   Written patient instructions provided. Patient verbalized understanding of treatment plan.  Total time in face to face counseling 30 minutes.    Follow-up:  Pharmacist: 11/26/2023 PCP clinic visit: 10/27/2023  Patient seen with:  Erasmo Leventhal, PharmD Candidate  Class of 2025 [employer] SOP   Butch Penny, PharmD, [place], CPP Clinical Pharmacist [facility] [phone]

## 2023-10-05 ENCOUNTER — Telehealth: Payer: Self-pay | Admitting: Pharmacist

## 2023-10-05 ENCOUNTER — Ambulatory Visit: Payer: Medicaid Other | Attending: Nurse Practitioner | Admitting: Pharmacist

## 2023-10-05 ENCOUNTER — Other Ambulatory Visit: Payer: Self-pay

## 2023-10-05 ENCOUNTER — Encounter: Payer: Self-pay | Admitting: Pharmacist

## 2023-10-05 DIAGNOSIS — E1165 Type 2 diabetes mellitus with hyperglycemia: Secondary | ICD-10-CM | POA: Diagnosis not present

## 2023-10-05 DIAGNOSIS — Z7984 Long term (current) use of oral hypoglycemic drugs: Secondary | ICD-10-CM

## 2023-10-05 DIAGNOSIS — R748 Abnormal levels of other serum enzymes: Secondary | ICD-10-CM | POA: Diagnosis not present

## 2023-10-05 DIAGNOSIS — Z7985 Long-term (current) use of injectable non-insulin antidiabetic drugs: Secondary | ICD-10-CM | POA: Diagnosis not present

## 2023-10-05 MED ORDER — METFORMIN HCL ER 500 MG PO TB24
1000.0000 mg | ORAL_TABLET | Freq: Two times a day (BID) | ORAL | 1 refills | Status: DC
  Filled 2023-10-05: qty 180, 45d supply, fill #0
  Filled 2023-12-18: qty 180, 45d supply, fill #1

## 2023-10-05 MED ORDER — DEXCOM G7 RECEIVER DEVI
0 refills | Status: AC
  Filled 2023-10-05: qty 1, 365d supply, fill #0

## 2023-10-05 MED ORDER — OZEMPIC (0.25 OR 0.5 MG/DOSE) 2 MG/3ML ~~LOC~~ SOPN
0.5000 mg | PEN_INJECTOR | SUBCUTANEOUS | 1 refills | Status: AC
  Filled 2023-10-05: qty 3, fill #0
  Filled 2023-11-01 – 2023-12-18 (×2): qty 3, 28d supply, fill #0
  Filled 2024-02-04 (×2): qty 3, 28d supply, fill #1

## 2023-10-05 MED ORDER — DEXCOM G7 SENSOR MISC
6 refills | Status: AC
  Filled 2023-10-05: qty 3, 30d supply, fill #0
  Filled 2023-11-01 – 2023-12-18 (×2): qty 3, 30d supply, fill #1
  Filled 2024-02-04: qty 3, 30d supply, fill #2

## 2023-10-05 NOTE — Telephone Encounter (Signed)
I think she prefers the receiver instead of the phone app. Is okay to attempt for the receiver?

## 2023-10-05 NOTE — Telephone Encounter (Signed)
Can we submit a PA for patient's Dexcom?

## 2023-10-06 ENCOUNTER — Encounter: Payer: Self-pay | Admitting: Pharmacist

## 2023-10-06 ENCOUNTER — Other Ambulatory Visit: Payer: Self-pay

## 2023-10-06 LAB — LIPID PANEL
Chol/HDL Ratio: 4.7 ratio — ABNORMAL HIGH (ref 0.0–4.4)
Cholesterol, Total: 189 mg/dL (ref 100–199)
HDL: 40 mg/dL (ref >39)
LDL Chol Calc (NIH): 111 mg/dL — ABNORMAL HIGH (ref 0–99)
Triglycerides: 220 mg/dL — ABNORMAL HIGH (ref 0–149)
VLDL Cholesterol Cal: 38 mg/dL (ref 5–40)

## 2023-10-06 LAB — CMP14+EGFR
ALT: 64 [IU]/L — ABNORMAL HIGH (ref 0–32)
AST: 38 [IU]/L (ref 0–40)
Albumin: 3.8 g/dL — ABNORMAL LOW (ref 4.0–5.0)
Alkaline Phosphatase: 143 [IU]/L — ABNORMAL HIGH (ref 44–121)
BUN/Creatinine Ratio: 29 — ABNORMAL HIGH (ref 9–23)
BUN: 15 mg/dL (ref 6–20)
Bilirubin Total: 0.3 mg/dL (ref 0.0–1.2)
CO2: 25 mmol/L (ref 20–29)
Calcium: 9.2 mg/dL (ref 8.7–10.2)
Chloride: 99 mmol/L (ref 96–106)
Creatinine, Ser: 0.51 mg/dL — ABNORMAL LOW (ref 0.57–1.00)
Globulin, Total: 3.3 g/dL (ref 1.5–4.5)
Glucose: 333 mg/dL — ABNORMAL HIGH (ref 70–99)
Potassium: 4.7 mmol/L (ref 3.5–5.2)
Sodium: 138 mmol/L (ref 134–144)
Total Protein: 7.1 g/dL (ref 6.0–8.5)
eGFR: 130 mL/min/{1.73_m2} (ref >59)

## 2023-10-06 LAB — HEPATIC FUNCTION PANEL: Bilirubin, Direct: 0.11 mg/dL (ref 0.00–0.40)

## 2023-10-27 ENCOUNTER — Ambulatory Visit: Payer: Medicaid Other | Admitting: Nurse Practitioner

## 2023-11-01 ENCOUNTER — Other Ambulatory Visit: Payer: Self-pay

## 2023-11-11 ENCOUNTER — Other Ambulatory Visit: Payer: Self-pay

## 2023-11-19 ENCOUNTER — Other Ambulatory Visit: Payer: Self-pay

## 2023-11-26 ENCOUNTER — Ambulatory Visit: Payer: Medicaid Other | Attending: Pharmacist | Admitting: Pharmacist

## 2023-12-20 ENCOUNTER — Other Ambulatory Visit: Payer: Self-pay

## 2023-12-28 ENCOUNTER — Other Ambulatory Visit: Payer: Self-pay

## 2024-02-04 ENCOUNTER — Other Ambulatory Visit: Payer: Self-pay | Admitting: Family Medicine

## 2024-02-04 ENCOUNTER — Other Ambulatory Visit: Payer: Self-pay

## 2024-02-04 DIAGNOSIS — E1165 Type 2 diabetes mellitus with hyperglycemia: Secondary | ICD-10-CM

## 2024-02-04 MED ORDER — METFORMIN HCL ER 500 MG PO TB24
1000.0000 mg | ORAL_TABLET | Freq: Two times a day (BID) | ORAL | 0 refills | Status: AC
  Filled 2024-02-04: qty 120, 30d supply, fill #0

## 2024-02-14 ENCOUNTER — Other Ambulatory Visit: Payer: Self-pay

## 2024-03-06 ENCOUNTER — Other Ambulatory Visit: Payer: Self-pay

## 2024-06-03 ENCOUNTER — Telehealth: Admitting: Nurse Practitioner

## 2024-06-03 DIAGNOSIS — B353 Tinea pedis: Secondary | ICD-10-CM | POA: Diagnosis not present

## 2024-06-03 DIAGNOSIS — B999 Unspecified infectious disease: Secondary | ICD-10-CM

## 2024-06-03 MED ORDER — KETOCONAZOLE 2 % EX CREA: 1.0000 | TOPICAL_CREAM | Freq: Every day | CUTANEOUS | 0 refills | Status: AC

## 2024-06-03 MED ORDER — SULFAMETHOXAZOLE-TRIMETHOPRIM 800-160 MG PO TABS: 1.0000 | ORAL_TABLET | Freq: Two times a day (BID) | ORAL | 0 refills | Status: AC

## 2024-06-03 NOTE — Progress Notes (Signed)
 I have spent 5 minutes in review of e-visit questionnaire, review and updating patient chart, medical decision making and response to patient.   Claiborne Rigg, NP

## 2024-06-03 NOTE — Progress Notes (Signed)
 Julie Banks I need you to make an appointment with me to evaluate your foot. With your A1c being so high the last time I saw you. This is a severe fungal infection with now superimposed bacterial infection. I am going to send in a pill antibiotic and fungal cream. I need you to call the office Monday and get scheduled for an appointment.   Based on what you shared with me it looks like you have cellulitis.  Cellulitis looks like areas of skin redness, swelling, and warmth; it develops as a result of bacteria entering under the skin. Little red spots and/or bleeding can be seen in skin, and tiny surface sacs containing fluid can occur. Fever can be present. Cellulitis is almost always on one side of a body, and the lower limbs are the most common site of involvement.   I have prescribed:  Bactrim  DS 1 tablet by mouth twice a day for 7 days and a fungal cream  HOME CARE:  Take your medications as ordered and take all of them, even if the skin irritation appears to be healing.   GET HELP RIGHT AWAY IF:  Symptoms that don't begin to go away within 48 hours. Severe redness persists or worsens If the area turns color, spreads or swells. If it blisters and opens, develops yellow-brown crust or bleeds. You develop a fever or chills. If the pain increases or becomes unbearable.  Are unable to keep fluids and food down.  MAKE SURE YOU   Understand these instructions. Will watch your condition. Will get help right away if you are not doing well or get worse.  Thank you for choosing an e-visit.  Your e-visit answers were reviewed by a board certified advanced clinical practitioner to complete your personal care plan. Depending upon the condition, your plan could have included both over the counter or prescription medications.  Please review your pharmacy choice. Make sure the pharmacy is open so you can pick up prescription now. If there is a problem, you may contact your provider through [employer] and have the prescription routed to another pharmacy.  Your safety is important to us . If you have drug allergies check your prescription carefully.   For the next 24 hours you can use MyChart to ask questions about today's visit, request a non-urgent call back, or ask for a work or school excuse. You will get an email in the next two days asking about your experience. I hope that your e-visit has been valuable and will speed your recovery.

## 2024-08-25 ENCOUNTER — Telehealth: Admitting: Nurse Practitioner

## 2024-08-25 DIAGNOSIS — J029 Acute pharyngitis, unspecified: Secondary | ICD-10-CM

## 2024-08-25 DIAGNOSIS — M791 Myalgia, unspecified site: Secondary | ICD-10-CM

## 2024-08-25 DIAGNOSIS — R051 Acute cough: Secondary | ICD-10-CM

## 2024-08-25 DIAGNOSIS — R6889 Other general symptoms and signs: Secondary | ICD-10-CM | POA: Diagnosis not present

## 2024-08-26 MED ORDER — OSELTAMIVIR PHOSPHATE 75 MG PO CAPS: 75.0000 mg | ORAL_CAPSULE | Freq: Two times a day (BID) | ORAL | 0 refills | Status: AC

## 2024-08-26 NOTE — Progress Notes (Signed)
 E visit for Flu like symptoms   We are sorry that you are not feeling well.  Here is how we plan to help!  Julie Banks I recommend you take a COVID test to make sure you don't have COVID before you pick up Tamiflu . If you have COVID that would be a different treatment.   Based on what you have shared with me it looks like you may have a respiratory virus that may be influenza.  Influenza or "the flu" is   an infection caused by a respiratory virus. The flu virus is highly contagious and persons who did not receive their yearly flu vaccination may "catch" the flu from close contact.  We have anti-viral medications to treat the viruses that cause this infection. They are not a "cure" and only shorten the course of the infection. These prescriptions are most effective when they are given within the first 2 days of "flu" symptoms. Antiviral medications are indicated if you have a high risk of complications from the flu. You should  also consider an antiviral medication if you are in close contact with someone who is at risk. These medications can help patients avoid complications from the flu but have side effects that you should know. Possible side effects from Tamiflu  or oseltamivir  include nausea, vomiting, diarrhea, dizziness, headaches, eye redness, sleep problems or other respiratory symptoms. You should not take Tamiflu  if you have an allergy to oseltamivir  or any to the ingredients in Tamiflu .  Based upon your symptoms and potential risk factors I have prescribed Oseltamivir  (Tamiflu ).  It has been sent to your designated pharmacy.  You will take one 75 mg capsule orally twice a day for the next 5 days.  ANYONE WHO HAS FLU SYMPTOMS SHOULD: Stay home. The flu is highly contagious and going out or to work exposes others! Be sure to drink plenty of fluids. Water is fine as well as fruit juices, sodas and electrolyte beverages. You may want to stay away from caffeine or alcohol. If you are nauseated,  try taking small sips of liquids. How do you know if you are getting enough fluid? Your urine should be a pale yellow or almost colorless. Get rest. Taking a steamy shower or using a humidifier may help nasal congestion and ease sore throat pain. Using a saline nasal spray works much the same way. Cough drops, hard candies and sore throat lozenges may ease your cough. Line up a caregiver. Have someone check on you regularly.   GET HELP RIGHT AWAY IF: You cannot keep down liquids or your medications. You become short of breath Your fell like you are going to pass out or loose consciousness. Your symptoms persist after you have completed your treatment plan MAKE SURE YOU  Understand these instructions. Will watch your condition. Will get help right away if you are not doing well or get worse.  Your e-visit answers were reviewed by a board certified advanced clinical practitioner to complete your personal care plan.  Depending on the condition, your plan could have included both over the counter or prescription medications.  If there is a problem please reply  once you have received a response from your provider.  Your safety is important to us .  If you have drug allergies check your prescription carefully.    You can use MyChart to ask questions about today's visit, request a non-urgent call back, or ask for a work or school excuse for 24 hours related to this e-Visit. If it has  been greater than 24 hours you will need to follow up with your provider, or enter a new e-Visit to address those concerns.  You will get an e-mail in the next two days asking about your experience.  I hope that your e-visit has been valuable and will speed your recovery. Thank you for using e-visits.   I have spent 5 minutes in review of e-visit questionnaire, review and updating patient chart, medical decision making and response to patient.   Julie Sugrue W Aydenn Gervin, NP

## 2024-09-11 ENCOUNTER — Other Ambulatory Visit (HOSPITAL_COMMUNITY)
Admission: RE | Admit: 2024-09-11 | Discharge: 2024-09-11 | Disposition: A | Discharge status: Home / Self Care | Source: Ambulatory Visit | Attending: Infectious Disease | Admitting: Infectious Disease

## 2024-09-11 ENCOUNTER — Other Ambulatory Visit: Payer: Self-pay

## 2024-09-11 ENCOUNTER — Other Ambulatory Visit (HOSPITAL_COMMUNITY): Payer: Self-pay

## 2024-09-11 ENCOUNTER — Ambulatory Visit (INDEPENDENT_AMBULATORY_CARE_PROVIDER_SITE_OTHER): Admitting: Pharmacist

## 2024-09-11 DIAGNOSIS — Z7189 Other specified counseling: Secondary | ICD-10-CM

## 2024-09-11 DIAGNOSIS — Z114 Encounter for screening for human immunodeficiency virus [HIV]: Secondary | ICD-10-CM

## 2024-09-11 DIAGNOSIS — Z113 Encounter for screening for infections with a predominantly sexual mode of transmission: Secondary | ICD-10-CM | POA: Insufficient documentation

## 2024-09-11 DIAGNOSIS — Z0000 Encounter for general adult medical examination without abnormal findings: Secondary | ICD-10-CM

## 2024-09-11 DIAGNOSIS — Z2981 Encounter for HIV pre-exposure prophylaxis: Secondary | ICD-10-CM

## 2024-09-11 NOTE — Progress Notes (Signed)
 NEW REFERRAL TO CPP CLINIC    HPI: Julie Banks is a 28 y.o. female who presents to the [facility] pharmacy clinic to discuss and initiate PrEP.  Referring ID Provider: Dr. 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  Check blood glucose level by fingerstick 2-3 times per day.   ALBUTEROL  (VENTOLIN  HFA) 108 (90 BASE) MCG/ACT INHALER    Inhale 1-2 puffs into the lungs every 6 (six) hours as needed for wheezing or shortness of breath.   ATORVASTATIN  (LIPITOR) 20 MG TABLET    Take 1 tablet (20 mg total) by mouth daily.   BLOOD GLUCOSE MONITORING SUPPL (ACCU-CHEK GUIDE ME) W/DEVICE KIT    Check blood glucose level by fingerstick 2-3 times per day.   BUSPIRONE  (BUSPAR ) 10 MG TABLET    Take 0.5-1 tablets (5-10 mg total) by mouth 2 (two) times daily.   CONTINUOUS GLUCOSE RECEIVER (DEXCOM G7 RECEIVER) DEVI    Use to check blood glucose continuously.   CONTINUOUS GLUCOSE SENSOR (DEXCOM G7 SENSOR) MISC    Use to check blood glucose throughout the day. Changes sensors once every 10 days.   FLUTICASONE  (FLONASE ) 50 MCG/ACT NASAL SPRAY    Place 1 spray into both nostrils daily.   GLUCOSE BLOOD (ACCU-CHEK GUIDE) TEST STRIP    Use as instructed. Check blood glucose level by fingerstick 2-3 times per day.   KETOCONAZOLE  (NIZORAL ) 2 % CREAM    Apply 1 Application topically daily.   METFORMIN  (GLUCOPHAGE -XR) 500 MG 24 HR TABLET    Take 2 tablets (1,000 mg total) by mouth 2 (two) times daily with a meal.   SEMAGLUTIDE ,0.25 OR 0.5MG /DOS, (OZEMPIC , 0.25 OR 0.5 MG/DOSE,) 2 MG/3ML SOPN    Inject 0.5 mg into the skin once a week.   TRAZODONE  (DESYREL ) 50 MG TABLET    Take 1  tablet (50 mg total) by mouth at bedtime for 30 days.  Modified Medications   No medications on file  Discontinued Medications   No medications on file        No data to display          Labs:  SCr: Lab Results  Component Value Date   CREATININE 0.51 (L) 10/05/2023   CREATININE 0.52 (L) 07/27/2023   CREATININE 0.70 01/21/2019   CREATININE 0.58 01/01/2015   CREATININE 0.65 11/30/2014   HIV Lab Results  Component Value Date   HIV Non Reactive 01/12/2015   Hepatitis B No results found for: HEPBSAB, HEPBSAG, HEPBCAB Hepatitis C No results found for: HEPCAB, HCVRNAPCRQN Hepatitis A No results found for: HAV RPR and STI No results found for: LABRPR, RPRTITER  STI Results GC CT  01/12/2015 12:00 AM NG: Negative  CT: Negative     Assessment: Julie Banks is here today accompanied by her boyfriend. He originally had an appointment for STI testing. After discussion with both of them, they each decided to start medication for HIV PrEP.   Julie Banks states that she has never been tested for STIs or HIV. She had a previous partner for a few years but has been with her current partner for about 10 months. Things are starting to get serious,  so they want to take precautions together. She does not know if any of her previous partners were HIV positive, she has never used PEP, and her and her current partner do not use condoms. She is not having any symptoms or issues today.  Discussed the medications available for PrEP (Truvada, Apretude, and Yeztugo). She elects to try Truvada. Counseled that Truvada is a one pill once daily regimen with or without food that can prevent HIV. Discussed the importance of taking the medication daily to provide protection and decreased adherence is associated with decreased efficacy. Also discussed how Truvada works to prevent HIV but not other STDs and encouraged the use of condoms. Counseled on what to do if dose is missed, if closer to missed  dose take immediately, if closer to next dose then skip and resume normal schedule.  Counseled that Truvada is normally well tolerated, however some patients experience a start up syndrome with nausea, diarrhea, dizziness, and fatigue but that those should resolve soon after starting treatment.  Advised that any nausea can be mitigated by taking it with food. Reviewed potential adverse events such as nephrotoxicity and bone density loss with Truvada. Discussed that we will be periodically checking kidney function throughout treatment. I reviewed medications and found no drug interactions. Discussed how our PrEP process works here at the clinic including follow ups and lab monitoring every 3 months.  Will order initial lab work today, send in Truvada if she is HIV negative, and see her back in 3 months for follow up. She is insured through [facility] and Truvada is $0. All questions answered.  Plan: - HIV ab, Hep A ab, Hep C ab, Hep B sAb, Hep B sAg, Hep B cAb, RPR, urine cytology, CMP, and lipid panel today - Truvada x 3 months if HIV negative - Follow up with me again on 12/05/24  Lorice Lafave L. Deaja Rizo, PharmD, BCIDP, AAHIVP, CPP Clinical Pharmacist Practitioner - Infectious Diseases Clinical Pharmacist Lead - Specialty Pharmacy [facility] for Infectious Disease

## 2024-09-12 LAB — COMPREHENSIVE METABOLIC PANEL WITH GFR
AG Ratio: 1.2 (calc) (ref 1.0–2.5)
ALT: 55 U/L — ABNORMAL HIGH (ref 6–29)
AST: 27 U/L (ref 10–30)
Albumin: 3.7 g/dL (ref 3.6–5.1)
Alkaline phosphatase (APISO): 119 U/L (ref 31–125)
BUN/Creatinine Ratio: 25 (calc) — ABNORMAL HIGH (ref 6–22)
BUN: 12 mg/dL (ref 7–25)
CO2: 27 mmol/L (ref 20–32)
Calcium: 9.1 mg/dL (ref 8.6–10.2)
Chloride: 101 mmol/L (ref 98–110)
Creat: 0.48 mg/dL — ABNORMAL LOW (ref 0.50–0.97)
Globulin: 3.2 g/dL (ref 1.9–3.7)
Glucose, Bld: 301 mg/dL — ABNORMAL HIGH (ref 65–99)
Potassium: 4.6 mmol/L (ref 3.5–5.3)
Sodium: 138 mmol/L (ref 135–146)
Total Bilirubin: 0.5 mg/dL (ref 0.2–1.2)
Total Protein: 6.9 g/dL (ref 6.1–8.1)
eGFR: 131 mL/min/1.73m2 (ref >=60)

## 2024-09-12 LAB — URINE CYTOLOGY ANCILLARY ONLY
Chlamydia: NEGATIVE
Comment: NEGATIVE
Comment: NORMAL
Neisseria Gonorrhea: NEGATIVE

## 2024-09-12 LAB — HEPATITIS A ANTIBODY, TOTAL: Hepatitis A AB,Total: REACTIVE — AB

## 2024-09-12 LAB — LIPID PANEL
Cholesterol: 165 mg/dL (ref <200)
HDL: 44 mg/dL — ABNORMAL LOW (ref >=50)
LDL Cholesterol (Calc): 96 mg/dL
Non-HDL Cholesterol (Calc): 121 mg/dL (ref <130)
Total CHOL/HDL Ratio: 3.8 (calc) (ref <5.0)
Triglycerides: 146 mg/dL (ref <150)

## 2024-09-12 LAB — HEPATITIS C ANTIBODY: Hepatitis C Ab: NONREACTIVE

## 2024-09-12 LAB — HEPATITIS B SURFACE ANTIBODY,QUALITATIVE: Hep B S Ab: NONREACTIVE

## 2024-09-12 LAB — RPR: RPR Ser Ql: NONREACTIVE

## 2024-09-12 LAB — HEPATITIS B SURFACE ANTIGEN: Hepatitis B Surface Ag: NONREACTIVE

## 2024-09-12 LAB — HEPATITIS B CORE ANTIBODY, TOTAL: Hep B Core Total Ab: NONREACTIVE

## 2024-09-12 LAB — HIV ANTIBODY (ROUTINE TESTING W REFLEX)
HIV 1&2 Ab, 4th Generation: NONREACTIVE
HIV FINAL INTERPRETATION: NEGATIVE

## 2024-09-13 ENCOUNTER — Other Ambulatory Visit: Payer: Self-pay | Admitting: Pharmacist

## 2024-09-13 ENCOUNTER — Ambulatory Visit: Payer: Self-pay | Admitting: Pharmacist

## 2024-09-13 ENCOUNTER — Other Ambulatory Visit: Payer: Self-pay

## 2024-09-13 ENCOUNTER — Other Ambulatory Visit (HOSPITAL_COMMUNITY): Payer: Self-pay

## 2024-09-13 DIAGNOSIS — Z114 Encounter for screening for human immunodeficiency virus [HIV]: Secondary | ICD-10-CM

## 2024-09-13 MED ORDER — EMTRICITABINE-TENOFOVIR DF 200-300 MG PO TABS
1.0000 | ORAL_TABLET | Freq: Every day | ORAL | 2 refills | Status: AC
  Filled 2024-09-13: qty 30, 30d supply, fill #0
  Filled 2024-10-12 – 2024-11-30 (×2): qty 30, 30d supply, fill #1

## 2024-09-13 NOTE — Progress Notes (Signed)
 Specialty Pharmacy Initial Fill Coordination Note  Julie Banks is a 28 y.o. female contacted today regarding initial fill of specialty medication(s) Emtricitabine-Tenofovir DF (TRUVADA)   Patient requested [name] at [facility] at [place] date: 09/14/24   Medication will be filled on 09/13/24.   Patient is aware of $0 copayment.

## 2024-09-13 NOTE — Progress Notes (Signed)
 Specialty Pharmacy Initiation Note   Julie Banks is a 28 y.o. female who will be followed by the specialty pharmacy service for RxSp HIV PrEP    Review of administration, indication, effectiveness, safety, potential side effects, storage/disposable, and missed dose instructions occurred today for patient's specialty medication(s) Emtricitabine-Tenofovir DF (TRUVADA)     Patient/Caregiver did not have any additional questions or concerns.   Patient's therapy is appropriate to: Initiate    Goals Addressed             This Visit's Progress    Comply with lab assessments       Patient is initiating therapy. Patient will adhere to provider and/or lab appointments.      Maintain optimal adherence to therapy       Patient is initiating therapy. Patient will work on increased adherence.      Minimize and address adverse drug events/drug interactions       Patient is initiating therapy. Patient will be monitored by provider to determine if a change in treatment plan is warranted.          Julie Banks L. Meer Reindl, PharmD, BCIDP, AAHIVP, CPP Infectious Diseases Clinical Pharmacist Practitioner Clinical Pharmacist Lead, Specialty Pharmacy [facility] for Infectious Disease

## 2024-09-14 ENCOUNTER — Other Ambulatory Visit (HOSPITAL_COMMUNITY): Payer: Self-pay

## 2024-09-26 ENCOUNTER — Other Ambulatory Visit: Payer: Self-pay

## 2024-10-12 ENCOUNTER — Other Ambulatory Visit (HOSPITAL_COMMUNITY): Payer: Self-pay

## 2024-10-17 ENCOUNTER — Other Ambulatory Visit: Payer: Self-pay

## 2024-10-20 ENCOUNTER — Other Ambulatory Visit: Payer: Self-pay

## 2024-11-30 ENCOUNTER — Other Ambulatory Visit (HOSPITAL_COMMUNITY): Payer: Self-pay

## 2024-11-30 ENCOUNTER — Other Ambulatory Visit: Payer: Self-pay

## 2024-11-30 NOTE — Progress Notes (Signed)
 Specialty Pharmacy Refill Coordination Note  Julie Banks is a 29 y.o. female contacted today regarding refills of specialty medication(s) Emtricitabine -Tenofovir  DF (TRUVADA )   Patient requested Pickup at [facility] at [place] date: 11/30/24   Medication will be filled on: 11/30/24

## 2024-12-05 ENCOUNTER — Ambulatory Visit: Admitting: Pharmacist

## 2024-12-05 NOTE — Progress Notes (Unsigned)
 "  HPI: RAYVN RICKERSON is a 29 y.o. female who presents to the [facility] pharmacy clinic for HIV PrEP follow-up.  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  Check blood glucose level by fingerstick 2-3 times per day.   ALBUTEROL  (VENTOLIN  HFA) 108 (90 BASE) MCG/ACT INHALER    Inhale 1-2 puffs into the lungs every 6 (six) hours as needed for wheezing or shortness of breath.   ATORVASTATIN  (LIPITOR) 20 MG TABLET    Take 1 tablet (20 mg total) by mouth daily.   BLOOD GLUCOSE MONITORING SUPPL (ACCU-CHEK GUIDE ME) W/DEVICE KIT    Check blood glucose level by fingerstick 2-3 times per day.   BUSPIRONE  (BUSPAR ) 10 MG TABLET    Take 0.5-1 tablets (5-10 mg total) by mouth 2 (two) times daily.   CONTINUOUS GLUCOSE RECEIVER (DEXCOM G7 RECEIVER) DEVI    Use to check blood glucose continuously.   CONTINUOUS GLUCOSE SENSOR (DEXCOM G7 SENSOR) MISC    Use to check blood glucose throughout the day. Changes sensors once every 10 days.   EMTRICITABINE -TENOFOVIR  (TRUVADA ) 200-300 MG TABLET    Take 1 tablet by mouth daily.   FLUTICASONE  (FLONASE ) 50 MCG/ACT NASAL SPRAY    Place 1 spray into both nostrils daily.   GLUCOSE BLOOD (ACCU-CHEK GUIDE) TEST STRIP    Use as instructed. Check blood glucose level by fingerstick 2-3 times per day.   KETOCONAZOLE  (NIZORAL ) 2 % CREAM    Apply 1 Application topically daily.   METFORMIN  (GLUCOPHAGE -XR) 500 MG 24 HR TABLET    Take 2 tablets (1,000 mg total) by mouth 2 (two) times daily with a meal.   SEMAGLUTIDE ,0.25 OR 0.5MG /DOS, (OZEMPIC , 0.25 OR 0.5 MG/DOSE,) 2 MG/3ML SOPN    Inject 0.5 mg into the skin once a  week.   TRAZODONE  (DESYREL ) 50 MG TABLET    Take 1 tablet (50 mg total) by mouth at bedtime for 30 days.  Modified Medications   No medications on file  Discontinued Medications   No medications on file        No data to display          Labs:  SCr: Lab Results  Component Value Date   CREATININE 0.48 (L) 09/11/2024   CREATININE 0.51 (L) 10/05/2023   CREATININE 0.52 (L) 07/27/2023   CREATININE 0.70 01/21/2019   CREATININE 0.58 01/01/2015   HIV Lab Results  Component Value Date   HIV NON-REACTIVE 09/11/2024   HIV Non Reactive 01/12/2015   Hepatitis B Lab Results  Component Value Date   HEPBSAB NON-REACTIVE 09/11/2024   HEPBSAG NON-REACTIVE 09/11/2024   HEPBCAB NON-REACTIVE 09/11/2024   Hepatitis C Lab Results  Component Value Date   HEPCAB NON-REACTIVE 09/11/2024   Hepatitis A Lab Results  Component Value Date   HAV REACTIVE (A) 09/11/2024   RPR and STI Lab Results  Component Value Date   LABRPR NON-REACTIVE 09/11/2024    STI Results GC CT  09/11/2024 11:16 AM Negative  Negative   01/12/2015 12:00 AM NG: Negative  CT: Negative     Assessment: Irianna is here today for 49-month follow-up for initiating  HIV PrEP in October. She says the Truvada  has been going well. She had some nausea at first, but this went away with time. For missed doses, she says she missed about a week between fills when she had trouble getting to the pharmacy. Otherwise she reports has been taking every day without issues. Reviewed s/sx of acute HIV including fever, chills, malaise, sore throat, and swollen lymph nodes. She denies having any of these symptoms since starting Truvada . She says she is still monogamous with same sexual partner as previous visit. She would like to continue taking Truvada .   Labs: Last HIV ab was negative on 09/11/24. Offered STI testing; Launi politely declines- no new sexual partners since last visit.  Eligible vaccinations: Offered flu, COVID-19,  hepatitis B, and Tdap vaccines; she says she has another appointment soon and will probably get them there.   Plan: - Check HIV test - Send Truvada  refills once HIV test negative; patient requests mail order so can send to [facility] for mail order - Follow up in 3 months with Cassie on 03/07/25  Maurilio Patten, PharmD PGY1 Pharmacy Resident [facility] 12/05/2024 8:11 PM "

## 2024-12-06 ENCOUNTER — Ambulatory Visit: Admitting: Pharmacist

## 2024-12-06 ENCOUNTER — Other Ambulatory Visit: Payer: Self-pay

## 2024-12-06 DIAGNOSIS — Z2981 Encounter for HIV pre-exposure prophylaxis: Secondary | ICD-10-CM | POA: Diagnosis present

## 2024-12-06 NOTE — Progress Notes (Signed)
 SABRA

## 2024-12-07 LAB — HIV ANTIBODY (ROUTINE TESTING W REFLEX)
HIV 1&2 Ab, 4th Generation: NONREACTIVE
HIV FINAL INTERPRETATION: NEGATIVE

## 2024-12-08 ENCOUNTER — Other Ambulatory Visit: Payer: Self-pay | Admitting: Pharmacist

## 2024-12-08 DIAGNOSIS — Z7189 Other specified counseling: Secondary | ICD-10-CM

## 2024-12-08 MED ORDER — EMTRICITABINE-TENOFOVIR DF 200-300 MG PO TABS
1.0000 | ORAL_TABLET | Freq: Every day | ORAL | 2 refills | Status: AC
  Filled 2024-12-11 – 2024-12-27 (×2): qty 30, 30d supply, fill #0

## 2024-12-11 ENCOUNTER — Other Ambulatory Visit: Payer: Self-pay

## 2024-12-11 ENCOUNTER — Other Ambulatory Visit (HOSPITAL_COMMUNITY): Payer: Self-pay

## 2024-12-27 ENCOUNTER — Other Ambulatory Visit: Payer: Self-pay

## 2024-12-29 ENCOUNTER — Other Ambulatory Visit: Payer: Self-pay

## 2025-03-07 ENCOUNTER — Ambulatory Visit: Payer: Self-pay | Admitting: Pharmacist
